# Patient Record
Sex: Female | Born: 1945 | Race: White | Hispanic: No | Marital: Married | Smoking: Never smoker
Health system: Southern US, Community
[De-identification: ages and names within clinical notes are randomized; demographics above are authoritative.]

## PROBLEM LIST (undated history)

## (undated) DIAGNOSIS — K5790 Diverticulosis of intestine, part unspecified, without perforation or abscess without bleeding: Secondary | ICD-10-CM

## (undated) DIAGNOSIS — K2970 Gastritis, unspecified, without bleeding: Secondary | ICD-10-CM

## (undated) DIAGNOSIS — Z9889 Other specified postprocedural states: Secondary | ICD-10-CM

## (undated) DIAGNOSIS — M109 Gout, unspecified: Secondary | ICD-10-CM

## (undated) DIAGNOSIS — K649 Unspecified hemorrhoids: Secondary | ICD-10-CM

## (undated) DIAGNOSIS — H9190 Unspecified hearing loss, unspecified ear: Secondary | ICD-10-CM

## (undated) DIAGNOSIS — F419 Anxiety disorder, unspecified: Secondary | ICD-10-CM

## (undated) DIAGNOSIS — R011 Cardiac murmur, unspecified: Secondary | ICD-10-CM

## (undated) DIAGNOSIS — T7840XA Allergy, unspecified, initial encounter: Secondary | ICD-10-CM

## (undated) DIAGNOSIS — Z87442 Personal history of urinary calculi: Secondary | ICD-10-CM

## (undated) DIAGNOSIS — N200 Calculus of kidney: Secondary | ICD-10-CM

## (undated) DIAGNOSIS — K219 Gastro-esophageal reflux disease without esophagitis: Secondary | ICD-10-CM

## (undated) DIAGNOSIS — IMO0001 Reserved for inherently not codable concepts without codable children: Secondary | ICD-10-CM

## (undated) DIAGNOSIS — E039 Hypothyroidism, unspecified: Secondary | ICD-10-CM

## (undated) DIAGNOSIS — E785 Hyperlipidemia, unspecified: Secondary | ICD-10-CM

## (undated) DIAGNOSIS — M1990 Unspecified osteoarthritis, unspecified site: Secondary | ICD-10-CM

## (undated) DIAGNOSIS — K760 Fatty (change of) liver, not elsewhere classified: Secondary | ICD-10-CM

## (undated) DIAGNOSIS — M8580 Other specified disorders of bone density and structure, unspecified site: Secondary | ICD-10-CM

## (undated) DIAGNOSIS — R112 Nausea with vomiting, unspecified: Secondary | ICD-10-CM

## (undated) DIAGNOSIS — I10 Essential (primary) hypertension: Secondary | ICD-10-CM

## (undated) DIAGNOSIS — E079 Disorder of thyroid, unspecified: Secondary | ICD-10-CM

## (undated) HISTORY — DX: Essential (primary) hypertension: I10

## (undated) HISTORY — PX: KNEE ARTHROSCOPY: SUR90

## (undated) HISTORY — DX: Disorder of thyroid, unspecified: E07.9

## (undated) HISTORY — PX: COLONOSCOPY: SHX174

## (undated) HISTORY — DX: Unspecified osteoarthritis, unspecified site: M19.90

## (undated) HISTORY — DX: Gastritis, unspecified, without bleeding: K29.70

## (undated) HISTORY — DX: Calculus of kidney: N20.0

## (undated) HISTORY — DX: Hypothyroidism, unspecified: E03.9

## (undated) HISTORY — PX: TONSILLECTOMY: SUR1361

## (undated) HISTORY — DX: Unspecified hearing loss, unspecified ear: H91.90

## (undated) HISTORY — DX: Reserved for inherently not codable concepts without codable children: IMO0001

## (undated) HISTORY — DX: Anxiety disorder, unspecified: F41.9

## (undated) HISTORY — DX: Other specified disorders of bone density and structure, unspecified site: M85.80

## (undated) HISTORY — PX: OTHER SURGICAL HISTORY: SHX169

## (undated) HISTORY — DX: Cardiac murmur, unspecified: R01.1

## (undated) HISTORY — DX: Fatty (change of) liver, not elsewhere classified: K76.0

## (undated) HISTORY — DX: Hyperlipidemia, unspecified: E78.5

## (undated) HISTORY — DX: Gastro-esophageal reflux disease without esophagitis: K21.9

## (undated) HISTORY — PX: TUBAL LIGATION: SHX77

## (undated) HISTORY — DX: Gout, unspecified: M10.9

## (undated) HISTORY — DX: Unspecified hemorrhoids: K64.9

## (undated) HISTORY — DX: Allergy, unspecified, initial encounter: T78.40XA

## (undated) HISTORY — DX: Diverticulosis of intestine, part unspecified, without perforation or abscess without bleeding: K57.90

---

## 1966-02-12 HISTORY — PX: OVARIAN CYST SURGERY: SHX726

## 1973-02-12 HISTORY — PX: DILATION AND CURETTAGE OF UTERUS: SHX78

## 1985-02-12 HISTORY — PX: TOTAL ABDOMINAL HYSTERECTOMY W/ BILATERAL SALPINGOOPHORECTOMY: SHX83

## 1993-02-12 HISTORY — PX: KNEE ARTHROSCOPY: SUR90

## 1996-02-13 HISTORY — PX: HAND SURGERY: SHX662

## 1997-05-21 ENCOUNTER — Ambulatory Visit (HOSPITAL_COMMUNITY): Admission: RE | Admit: 1997-05-21 | Discharge: 1997-05-21 | Payer: Self-pay | Admitting: *Deleted

## 1999-02-13 HISTORY — PX: WRIST FRACTURE SURGERY: SHX121

## 1999-07-14 ENCOUNTER — Encounter: Admission: RE | Admit: 1999-07-14 | Discharge: 1999-07-14 | Payer: Self-pay | Admitting: *Deleted

## 1999-07-14 ENCOUNTER — Encounter: Payer: Self-pay | Admitting: *Deleted

## 1999-07-31 ENCOUNTER — Encounter: Payer: Self-pay | Admitting: Emergency Medicine

## 1999-07-31 ENCOUNTER — Emergency Department (HOSPITAL_COMMUNITY): Admission: EM | Admit: 1999-07-31 | Discharge: 1999-07-31 | Payer: Self-pay | Admitting: Emergency Medicine

## 2000-07-18 ENCOUNTER — Encounter: Admission: RE | Admit: 2000-07-18 | Discharge: 2000-07-18 | Payer: Self-pay | Admitting: Internal Medicine

## 2000-07-18 ENCOUNTER — Encounter: Payer: Self-pay | Admitting: Internal Medicine

## 2001-07-21 ENCOUNTER — Encounter: Payer: Self-pay | Admitting: Internal Medicine

## 2001-07-21 ENCOUNTER — Ambulatory Visit (HOSPITAL_COMMUNITY): Admission: RE | Admit: 2001-07-21 | Discharge: 2001-07-21 | Payer: Self-pay | Admitting: Internal Medicine

## 2001-11-10 ENCOUNTER — Encounter: Payer: Self-pay | Admitting: Internal Medicine

## 2001-11-10 ENCOUNTER — Ambulatory Visit (HOSPITAL_COMMUNITY): Admission: RE | Admit: 2001-11-10 | Discharge: 2001-11-10 | Payer: Self-pay | Admitting: Internal Medicine

## 2002-07-27 ENCOUNTER — Encounter: Payer: Self-pay | Admitting: Internal Medicine

## 2002-07-27 ENCOUNTER — Encounter: Admission: RE | Admit: 2002-07-27 | Discharge: 2002-07-27 | Payer: Self-pay | Admitting: Internal Medicine

## 2002-12-22 ENCOUNTER — Encounter (INDEPENDENT_AMBULATORY_CARE_PROVIDER_SITE_OTHER): Payer: Self-pay | Admitting: *Deleted

## 2002-12-22 ENCOUNTER — Ambulatory Visit (HOSPITAL_COMMUNITY): Admission: RE | Admit: 2002-12-22 | Discharge: 2002-12-22 | Payer: Self-pay | Admitting: Internal Medicine

## 2003-02-10 ENCOUNTER — Encounter: Payer: Self-pay | Admitting: Internal Medicine

## 2003-07-30 ENCOUNTER — Encounter: Admission: RE | Admit: 2003-07-30 | Discharge: 2003-07-30 | Payer: Self-pay | Admitting: Internal Medicine

## 2003-09-06 ENCOUNTER — Encounter: Payer: Self-pay | Admitting: Internal Medicine

## 2004-07-31 ENCOUNTER — Encounter: Admission: RE | Admit: 2004-07-31 | Discharge: 2004-07-31 | Payer: Self-pay | Admitting: Internal Medicine

## 2005-08-02 ENCOUNTER — Encounter: Admission: RE | Admit: 2005-08-02 | Discharge: 2005-08-02 | Payer: Self-pay | Admitting: Internal Medicine

## 2006-08-15 ENCOUNTER — Encounter: Admission: RE | Admit: 2006-08-15 | Discharge: 2006-08-15 | Payer: Self-pay | Admitting: *Deleted

## 2007-01-02 ENCOUNTER — Encounter: Admission: RE | Admit: 2007-01-02 | Discharge: 2007-02-12 | Payer: Self-pay | Admitting: Orthopedic Surgery

## 2007-01-11 ENCOUNTER — Ambulatory Visit (HOSPITAL_COMMUNITY): Admission: RE | Admit: 2007-01-11 | Discharge: 2007-01-11 | Payer: Self-pay | Admitting: Orthopedic Surgery

## 2007-08-19 ENCOUNTER — Encounter: Admission: RE | Admit: 2007-08-19 | Discharge: 2007-08-19 | Payer: Self-pay | Admitting: Family Medicine

## 2008-03-24 ENCOUNTER — Other Ambulatory Visit: Admission: RE | Admit: 2008-03-24 | Discharge: 2008-03-24 | Payer: Self-pay | Admitting: Family Medicine

## 2008-08-19 ENCOUNTER — Encounter: Admission: RE | Admit: 2008-08-19 | Discharge: 2008-08-19 | Payer: Self-pay | Admitting: Family Medicine

## 2008-08-30 ENCOUNTER — Encounter: Admission: RE | Admit: 2008-08-30 | Discharge: 2008-08-30 | Payer: Self-pay | Admitting: Family Medicine

## 2009-04-21 ENCOUNTER — Ambulatory Visit (HOSPITAL_COMMUNITY): Admission: RE | Admit: 2009-04-21 | Discharge: 2009-04-21 | Payer: Self-pay | Admitting: Family Medicine

## 2009-08-26 ENCOUNTER — Encounter: Admission: RE | Admit: 2009-08-26 | Discharge: 2009-08-26 | Payer: Self-pay | Admitting: Family Medicine

## 2009-09-20 DIAGNOSIS — Z87442 Personal history of urinary calculi: Secondary | ICD-10-CM | POA: Insufficient documentation

## 2009-09-20 DIAGNOSIS — K5730 Diverticulosis of large intestine without perforation or abscess without bleeding: Secondary | ICD-10-CM | POA: Insufficient documentation

## 2009-09-20 DIAGNOSIS — R109 Unspecified abdominal pain: Secondary | ICD-10-CM | POA: Insufficient documentation

## 2009-09-20 DIAGNOSIS — K7689 Other specified diseases of liver: Secondary | ICD-10-CM | POA: Insufficient documentation

## 2009-09-20 DIAGNOSIS — M1990 Unspecified osteoarthritis, unspecified site: Secondary | ICD-10-CM | POA: Insufficient documentation

## 2009-09-20 DIAGNOSIS — K649 Unspecified hemorrhoids: Secondary | ICD-10-CM | POA: Insufficient documentation

## 2009-09-20 DIAGNOSIS — E785 Hyperlipidemia, unspecified: Secondary | ICD-10-CM | POA: Insufficient documentation

## 2009-09-20 DIAGNOSIS — I10 Essential (primary) hypertension: Secondary | ICD-10-CM | POA: Insufficient documentation

## 2009-09-20 DIAGNOSIS — Z8719 Personal history of other diseases of the digestive system: Secondary | ICD-10-CM | POA: Insufficient documentation

## 2009-09-26 ENCOUNTER — Ambulatory Visit: Payer: Self-pay | Admitting: Internal Medicine

## 2009-09-26 DIAGNOSIS — E039 Hypothyroidism, unspecified: Secondary | ICD-10-CM | POA: Insufficient documentation

## 2009-09-26 DIAGNOSIS — F411 Generalized anxiety disorder: Secondary | ICD-10-CM | POA: Insufficient documentation

## 2009-10-04 ENCOUNTER — Ambulatory Visit (HOSPITAL_COMMUNITY): Admission: RE | Admit: 2009-10-04 | Discharge: 2009-10-04 | Payer: Self-pay | Admitting: Internal Medicine

## 2009-10-06 ENCOUNTER — Telehealth: Payer: Self-pay | Admitting: Internal Medicine

## 2009-10-24 ENCOUNTER — Ambulatory Visit (HOSPITAL_COMMUNITY): Admission: RE | Admit: 2009-10-24 | Discharge: 2009-10-24 | Payer: Self-pay | Admitting: Internal Medicine

## 2010-01-11 ENCOUNTER — Telehealth: Payer: Self-pay | Admitting: Internal Medicine

## 2010-01-12 ENCOUNTER — Encounter: Payer: Self-pay | Admitting: Internal Medicine

## 2010-02-22 ENCOUNTER — Encounter (INDEPENDENT_AMBULATORY_CARE_PROVIDER_SITE_OTHER): Payer: Self-pay | Admitting: *Deleted

## 2010-02-23 ENCOUNTER — Ambulatory Visit
Admission: RE | Admit: 2010-02-23 | Discharge: 2010-02-23 | Payer: Self-pay | Source: Home / Self Care | Attending: Internal Medicine | Admitting: Internal Medicine

## 2010-03-02 ENCOUNTER — Other Ambulatory Visit: Payer: Self-pay | Admitting: Internal Medicine

## 2010-03-02 ENCOUNTER — Ambulatory Visit
Admission: RE | Admit: 2010-03-02 | Discharge: 2010-03-02 | Payer: Self-pay | Source: Home / Self Care | Attending: Internal Medicine | Admitting: Internal Medicine

## 2010-03-06 ENCOUNTER — Encounter: Payer: Self-pay | Admitting: Family Medicine

## 2010-03-07 ENCOUNTER — Encounter: Payer: Self-pay | Admitting: Internal Medicine

## 2010-03-16 NOTE — Progress Notes (Signed)
Summary: triage  Phone Note Call from Patient Call back at Home Phone [phone]   Caller: Patient Call For: Dr. Juanda Chance Reason for Call: Talk to Nurse Details for Reason: triage Summary of Call: Pt. called to state she had a vomiting episode last night.  She did try to double Protonix but could not tolerate it as it gave her severe diarrhea. She had been asked to inform Dr. Juanda Chance if she had any episodes.  Any questions, call pt. She will be at the # above until 1 p.m.  Otherwise, you may leave her a voice mail. Initial call taken by: Schuyler Amor,  January 11, 2010 10:55 AM  Follow-up for Phone Call         Patient saw Dr. Juanda Chance in 09/2009 and she told her to call for any episdoes of abdominal pain and vomiting.Patient calling to report an episode last night of abdominal pain in the epigastric area and vomiting. Patient reports this occurred after she ate a tuna salad sandwich and chicken rice soup. She is a "little sore in the right upper quadrant today but has not had any vomiting today.  She did try to take her Protonix two times a day as requested by Dr. Juanda Chance back in the fall but this gave her diarrhea so she has been taking it daily. Patient wants to know if she should change medications or get an EGD. Please, advise. Follow-up by: Jesse Fall RN,  January 11, 2010 11:50 AM  Additional Follow-up for Phone Call Additional follow up Details #1::        As per my last OV note 09/2009,   if vomiting recurrs, schedule EGD. Her sono and HIDA were normal. In place of Protonix she may purchase Pepcid  20 mg and take it two times a day. OK to schedule direct EGD. Additional Follow-up by: Hart Carwin MD,  January 11, 2010 10:18 PM    Additional Follow-up for Phone Call Additional follow up Details #2::    Message left for patient to call back at home number. Jesse Fall RN  January 12, 2010 9:41 AM Spoke with patient. Patient notified of Dr. Regino Schultze recommendations. She will  take Pepcid 20 mg two times a day in place of Protonix. Scheduled patient for direct EGD on 03/02/2010 at 9 AM with Dr. Juanda Chance and previsit on 02/23/2010 at 1:00 PM.  Previsit letter mailed. Follow-up by: Jesse Fall RN,  January 12, 2010 3:03 PM  New/Updated Medications: PEPCID 20 MG TABS (FAMOTIDINE) Take one by mouth two times a day

## 2010-03-16 NOTE — Miscellaneous (Signed)
Summary: [facility] Previsit/prep  Clinical Lists Changes  Allergies: Changed allergy or adverse reaction from SULFA to SULFA Changed allergy or adverse reaction from DARVON to DARVON Changed allergy or adverse reaction from MORPHINE to MORPHINE Changed allergy or adverse reaction from * EPINEPHRINE to * EPINEPHRINE

## 2010-03-16 NOTE — Assessment & Plan Note (Signed)
Summary: Gastroenterology  Vanessa Skinner  MR#:  [id number] Page #  NAME:  Vanessa Skinner, Vanessa Skinner   OFFICE NO:  [id number]  DATE:  02/10/03  DOB:  [birth year]  HISTORY OF PRESENT ILLNESS:  The patient is a very nice 65 year old white female, who comes for discussion of colorectal screening. She also has symptoms of epigastric discomfort, which comes periodically since she has been on Fosamax. She has also tried Actonel and calcium supplements. About four months ago, the patient had a physical exam and was found to have epigastric tenderness and pain. She was put on Protonix 40 mg twice a day for two weeks, which helped tremendously. But about after a month of Protonix, the pain started again. She had an ultrasound of the gallbladder, which showed a fatty liver but normal gallbladder and bile ducts. She is quite concerned about the fatty liver diagnosis. She states that she had normal liver function tests by Dr. Daphine Deutscher. She does not drink alcohol other than socially. She has never had blood transfusions or hepatitis.  Her symptoms consist of irregular bowel habits, mostly constipation; food intolerance after certain meals, especially after rich foods; and uncomfortable rectal feeling consistent with symptomatic hemorrhoids.  PAST MEDICAL HISTORY:  Significant for high blood pressure, hyperlipidemia, thyroid problems since 1987, degenerative joint disease since 1994, kidney stones in 1994 and 1995. Allergies and sinus trouble. Operations include hysterectomy in 1987, tubal ligation in 1976.  FAMILY HISTORY:  Positive for heart disease in both mother and father and grandparents, breast cancer in maternal aunt, diabetes in father, and alcoholism in sister.  SOCIAL HISTORY:  She is married. She has children. 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 She has a college degree. She does not smoke, and drinks alcohol only occasionally.  REVIEW OF SYSTEMS: Positive for eyeglasses, allergies, swelling of her  feet and legs, arthritis of the joints, skin rashes, vision changes, nose bleeds, itching, and hearing problems.  PHYSICAL EXAM:  Blood pressure 136/70. Age past 65. Weight 172 pounds. She is alert and oriented and in no distress. Skin is warm and dry. Sclerae nonicteric. Neck is supple without adenopathy. Lungs are clear to auscultation. COR: Normal S1 and S2. Abdomen is soft with tenderness in the epigastric area. Liver edge was at the costal margin and overall liver span was only about 9-10 cm. There is no tenderness in the liver. No ascites. Bowel sounds are normal. Spleen no palpable. Rectal exam: Normal rectal tone. Hemoccult negative stool.  IMPRESSION:   40.  A 65 year old white female with non-specific dyspepsia, which may be related to use to Fosamax. Rule out hiatal hernia, H. pylori gastritis. It is probably not a biliary problem because her ultrasound was negative. 2.  Fatty liver with normal liver function tests. No stigmata of chronic liver disease on physical exam. Fatty liver may be related to hyperlipidemia and mild obesity. 3.  Patient is a good candidate for screening colonoscopy because of her 65 She has no risk factors other than her age. Her symptoms are suggestive of irritable bowel syndrome or possibly symptomatic diverticulosis.  PLAN:  1.  Protonix 40 mg on a p.r.n. basis. 2.  Start Benefiber as a fiber supplement on a daily basis. 3.  Schedule upper endoscopy and a colonoscopy for evaluation of the upper gastrointestinal symptoms and for colorectal screening. Prep has been discussed with the patient. She did not have her schedule with her, and she has to check her schedule and call us  back concerning timing of her endoscopy and colonoscopy.    Hedwig Morton. Juanda Chance, M.D.  [id number] cc:  Vanessa Skinner D:  02/10/03; T: ; Job [id number]

## 2010-03-16 NOTE — Letter (Signed)
Summary: Patient [facility] Biopsy Results  [facility]  [address] [place], [place] [address]   Phone: [phone]  Fax: [phone]        March 07, 2010 MRN: [id number]    [facility] [address] [place], [place]  [address]    Dear Ms. Larue,  I am pleased to inform you that the biopsies taken during your recent endoscopic examination did not show any evidence of cancer upon pathologic examination.Th biopssies show mild gastritis  Additional information/recommendations:  __No further action is needed at this time.  Please follow-up with      your primary care physician for your other healthcare needs.  __ Please call [phone] to schedule a return visit to review      your condition.  _x_ Continue with the treatment plan as outlined on the day of your      exam.  _   Please call us if you are having persistent problems or have questions about your condition that have not been fully answered at this time.  Sincerely,  Hart Carwin MD  This letter has been electronically signed by your physician.  Appended Document: Patient Notice-Endo Biopsy Results LETTER MAILED

## 2010-03-16 NOTE — Procedures (Addendum)
Summary: Upper Endoscopy  Patient: Vanessa Skinner Note: All result statuses are Final unless otherwise noted.  Tests: (1) Upper Endoscopy (EGD)   EGD Upper Endoscopy       DONE     [facility]     [address]     [place], [place]  [address]           ENDOSCOPY PROCEDURE REPORT           PATIENT:  Vanessa Skinner, Vanessa Skinner  MR#:  [id number]     BIRTHDATE:  1946-01-16, 64 yrs. old  GENDER:  female           ENDOSCOPIST:  Hedwig Morton. Juanda Chance, MD     Referred by:  Merri Brunette, M.D.           PROCEDURE DATE:  03/02/2010     PROCEDURE:  EGD with biopsy, [address]     ASA CLASS:  Class II     INDICATIONS:  abdominal pain, dyspepsia gastritis on EGD 2005           MEDICATIONS:   Versed mg, Fentanyl 75 mcg     TOPICAL ANESTHETIC:  Exactacain Spray           DESCRIPTION OF PROCEDURE:   After the risks benefits and     alternatives of the procedure were thoroughly explained, informed     consent was obtained.  The LB GIF-H180 [id number] endoscope was     introduced through the mouth and advanced to the second portion of     the duodenum, without limitations.  The instrument was slowly     withdrawn as the mucosa was fully examined.     <<PROCEDUREIMAGES>>           Mild gastritis was found in the body and the antrum of the     stomach. With standard forceps, a biopsy was obtained and sent to     pathology (see image3).  Otherwise the examination was normal (see     image6, image5, image4, and image1). slightly tortuous esophagua     Retroflexed views revealed no abnormalities.    The scope was then     withdrawn from the patient and the procedure completed.           COMPLICATIONS:  None           ENDOSCOPIC IMPRESSION:     1) Mild gastritis in the body and the antrum of the stomach     2) Otherwise normal examination     s/p biopsies to r/o H.pylori     RECOMMENDATIONS:     1) Await biopsy results     continue Protonix 40 mg po qd           REPEAT EXAM:  In 0 year(s) for.        ______________________________     Hedwig Morton. Juanda Chance, MD           CC:  Merri Brunette, M.D.           n.     eSIGNED:   Hedwig Morton. Jakaila Norment at 03/02/2010 09:54 AM           Rikki Spearing, [id number]  Note: An exclamation mark (!) indicates a result that was not dispersed into the flowsheet. Document Creation Date: 03/02/2010 9:55 AM _______________________________________________________________________  (1) Order result status: Final Collection or observation date-time: 03/02/2010 09:46 Requested date-time:  Receipt date-time:  Reported date-time:  Referring Physician:   Ordering Physician: Verlee Monte  Juanda Chance [phone]) Specimen Source:  Source: Launa Grill Order Number: [id number] Lab site:   Appended Document: Upper Endoscopy no recall EGD

## 2010-03-16 NOTE — Letter (Signed)
Summary: Previsit letter  [facility]  [address] [place], [place] [address]   Phone: [phone]  Fax: [phone]       01/12/2010 MRN: [id number]  [facility] [address] [place], [place]  [address]  Dear Ms. Akhtar,  Welcome to the Gastroenterology Division at [facility].    You are scheduled to see a nurse for your pre-procedure visit on 02/23/2010 at 1:00 PM on the 3rd floor at [facility], [address]  We ask that you try to arrive at our office 15 minutes prior to your appointment time to allow for check-in.  Your nurse visit will consist of discussing your medical and surgical history, your immediate family medical history, and your medications.    Please bring a complete list of all your medications or, if you prefer, bring the medication bottles and we will list them.  We will need to be aware of both prescribed and over the counter drugs.  We will need to know exact dosage information as well.  If you are on blood thinners (Coumadin, Plavix, Aggrenox, Ticlid, etc.) please call our office today/prior to your appointment, as we need to consult with your physician about holding your medication.   Please be prepared to read and sign documents such as consent forms, a financial agreement, and acknowledgement forms.  If necessary, and with your consent, a friend or relative is welcome to sit-in on the nurse visit with you.  Please bring your insurance card so that we may make a copy of it.  If your insurance requires a referral to see a specialist, please bring your referral form from your primary care physician.  No co-pay is required for this nurse visit.     If you cannot keep your appointment, please call [phone] to cancel or reschedule prior to your appointment date.  This allows [place] the opportunity to schedule an appointment for another patient in need of care.    Thank you for choosing [facility] for your medical needs.   We appreciate the opportunity to care for you.  Please visit [place] at our website  to learn more about our practice.                     Sincerely.                                                                                                                   The Gastroenterology Division

## 2010-03-16 NOTE — Progress Notes (Signed)
Summary: Returning your call  Phone Note Call from Patient Call back at [phone]   Caller: Patient Call For: Dr. Juanda Chance Summary of Call: Pt returning your call re: labs  Initial call taken by: Misty Stanley,  October 06, 2009 10:15 AM  Follow-up for Phone Call        patient advied of HIDA scan scheduled for 11/02/09 8:00 at [facility].  patient aware to be NPO Follow-up by: Darcey Nora RN, CGRN,  October 06, 2009 10:45 AM

## 2010-03-16 NOTE — Assessment & Plan Note (Signed)
Summary: upper gastic pain--ch.   History of Present Illness Visit Type: Initial Visit Primary GI MD: Lina Sar MD Primary Alix Lahmann: Merri Brunette, MD Chief Complaint: Patient was having some bloating and epigasttic pain but she is doing good on Align she started in July.  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 The first time she had bacon annnd egg biscuit  and the second time had a barbecue sandwich. The pain was more to the right upper quadrant and was associated with diarrhea. There was no fever or jaundice. An upper abdominal ultrasound in November 2004 showed fatty infiltration of the liver. A colonoscopy in July 2005 showed diverticulosis of the left colon hemorrhoids. She has a positive family history of colon cancer in her grandmother who is 67 years old at the time of diagnosis. Patient has been on Protonix 40 mg daily which doesn not seem to help with the abdominal pain. An upper endoscopy in July 2005 showed acute gastritis possibly related to Fosamax. She has not taken any Fosamax or related medications over the past several years. She has tried probiotics which seemed to help.   GI Review of Systems    Reports bloating.      Denies abdominal pain, acid reflux, belching, chest pain, dysphagia with liquids, dysphagia with solids, heartburn, loss of appetite, nausea, vomiting, vomiting blood, weight loss, and  weight gain.        Denies anal fissure, black tarry stools, change in bowel habit, constipation, diarrhea, diverticulosis, fecal incontinence, heme positive stool, hemorrhoids, irritable bowel syndrome, jaundice, light color stool, liver problems, rectal bleeding, and  rectal pain. Preventive Screening-Counseling & Management      Drug Use:  no.      Current Medications (verified): 1)  Align  Caps (Probiotic Product)  .... Take One By Mouth Once Daily 2)  Synthroid 75 Mcg Tabs (Levothyroxine Sodium) .... Take One By Mouth Once Daily 3)  Tekturna 150 Mg Tabs (Aliskiren Fumarate) .... Take One By Mouth Once Daily 4)  Pravachol 20 Mg Tabs (Pravastatin Sodium) .... Take One By Mouth Once Daily 5)  Aspirin 81 Mg Tbec (Aspirin) .... Take One By Mouth Once Daily 6)  Protonix 40 Mg Tbec (Pantoprazole Sodium) .... Take One By Mouth As Needed 7)  Bentyl 20 Mg Tabs (Dicyclomine Hcl) .... Take One By Mouth As Needed 8)  Allegra 180 Mg Tabs (Fexofenadine Hcl) .... Take One By Mouth As Needed 9)  Lasix 20 Mg Tabs (Furosemide) .... Take One By Mouth As Needed 10)  Xanax 0.5 Mg Tabs (Alprazolam) .... Take One By Mouth As Needed 11)  Vitamin D 400 Unit Tabs (Cholecalciferol) .... Take One By Mouth As Needed  Allergies: 1)  ! Sulfa 2)  ! Darvon 3)  ! Morphine 4)  ! * Epinephrine  Past History:  Past Medical History: Current Problems:  HYPOTHYROIDISM (ICD-244.9) ANXIETY (ICD-300.00) ABDOMINAL PAIN, UNSPECIFIED SITE (ICD-789.00) HEMORRHOIDS (ICD-455.6) DIVERTICULOSIS, COLON (ICD-562.10) GASTRITIS, HX OF (ICD-V12.79) NEPHROLITHIASIS, HX OF (ICD-V13.01) DEGENERATIVE JOINT DISEASE (ICD-715.90) HYPERLIPIDEMIA (ICD-272.4) HYPERTENSION (ICD-401.9) Family Hx of COLON CANCER (ICD-153.9) FATTY LIVER DISEASE (ICD-571.8)  Past Surgical History: Hysterectomy D & C  Tonsillectomy RightHand Surgery Right Knee Arthroscopy Tubal Ligation left arm surgery  Family History: Family History of Colon Cancer: Maternal Grandmother Family History of Diabetes: Father Family History of Heart Disease: Mother, Father, Maternal  Grandfather Family History of Kidney Disease: Father  Social History: Occupation: Midwife Stay Alcohol Use - No Illicit Drug Use - no Daily Caffeine Use 2-3 per day  Review of Systems       The patient complains of allergy/sinus, anxiety-new, arthritis/joint pain, hearing problems, and  shortness of breath.  The patient denies anemia, back pain, blood in urine, breast changes/lumps, change in vision, confusion, cough, coughing up blood, depression-new, fainting, fatigue, fever, headaches-new, heart murmur, heart rhythm changes, itching, menstrual pain, muscle pains/cramps, night sweats, nosebleeds, pregnancy symptoms, skin rash, sleeping problems, sore throat, swelling of feet/legs, swollen lymph glands, thirst - excessive , urination - excessive , urination changes/pain, urine leakage, vision changes, and voice change.         Pertinent positive and negative review of systems were noted in the above HPI. All other ROS was otherwise negative.   Vital Signs:  Patient profile:   65 year old female Height:      64.5 inches Weight:      189.4 pounds BMI:     32.12 Pulse rate:   74 / minute Pulse rhythm:   regular BP sitting:   140 / 90  (left arm) Cuff size:   regular  Vitals Entered By: Harlow Mares CMA Duncan Dull) (September 26, 2009 2:58 PM)  Physical Exam  General:  alert, oriented, somewhat overweight. Eyes:  nonicteric. Mouth:  No deformity or lesions, dentition normal. Neck:  Supple; no masses or thyromegaly. Lungs:  Clear throughout to auscultation. Heart:  Regular rate and rhythm; no murmurs, rubs,  or bruits. Abdomen:  protuberant soft abdomen which is tender in right upper quadrant. Liver appears to be tender. Left upper and lower quadrants are normal. Normoactive bowel sounds. No tympany. Rectal:  soft Hemoccult negative stool. Extremities:  No clubbing, cyanosis, edema or deformities noted. Skin:  Intact without significant lesions or rashes. Psych:  Alert and cooperative. Normal mood and affect.   Impression & Recommendations:  Problem # 1:  ABDOMINAL PAIN, UNSPECIFIED SITE (ICD-789.00) consider symptomatic biliary disease, if sono negative, consider HIDA scan. If negative, will proceed with EGD. Orders: Ultrasound Abdomen (UAS)  Problem # 2:   DIVERTICULOSIS, COLON (ICD-562.10) Patient has moderately severe diverticulosis seen on a colonoscopy in 2005. She is not currently symptomatic.  Patient Instructions: 1)  Abdominal ultrasound, if negative proceed with HIDA scan. 2)  If biliary workup is negative, consider upper endoscopy.  3)  Continue Protonix 40 mg daily. 4)  Low-fat diet. 5)  Copy sent to : Dr Merri Brunette 6)  The medication list was reviewed and reconciled.  All changed / newly prescribed medications were explained.  A complete medication list was provided to the patient / caregiver. Prescriptions: BENTYL 20 MG TABS (DICYCLOMINE HCL) Take 1 tablet by mouth as needed for abdominal cramping  #30 x 1   Entered by:   Lamona Curl CMA (AAMA)   Authorized by:   Hart Carwin MD   Signed by:   Lamona Curl CMA (AAMA) on 09/26/2009   Method used:   Electronically to        [facility]* (retail)       [address]       [address] Shipping/mailing       [place], [place]  [address]       Ph: [phone]       Fax: [phone]   RxID:   [id number]

## 2010-03-16 NOTE — Procedures (Signed)
Summary: EGD   EGD  Procedure date:  09/06/2003  Findings:      Location: [facility]   Patient Name: Vanessa Skinner, Vanessa Skinner MRN:  Procedure Procedures: Panendoscopy (EGD) CPT: 43235.    with biopsy(s)/brushing(s). CPT: D1846139.  Personnel: Endoscopist: Dora L. Juanda Chance, MD.  Referred By: Maureen Ralphs Daphine Deutscher, MD.  Exam Location: Exam performed in Outpatient Clinic. Outpatient  Patient Consent: Procedure, Alternatives, Risks and Benefits discussed, consent obtained, from patient. Consent was obtained by the RN.  Indications Symptoms: Abdominal pain, location: epigastric.  History  Current Medications: Patient is taking a non-steroidal medication. Patient is not currently taking Coumadin.  Pre-Exam Physical: Performed Sep 06, 2003  Cardio-pulmonary exam, HEENT exam, Abdominal exam, Extremity exam, Neurological exam, Mental status exam WNL.  Exam Exam Info: Maximum depth of insertion Duodenum, intended Duodenum. Vocal cords visualized. Gastric retroflexion performed. Images taken. ASA Classification: I. Tolerance: good.  Sedation Meds: Patient assessed and found to be appropriate for moderate (conscious) sedation. Fentanyl 50 mcg. given IV. Versed 5 mg. given IV. Cetacaine Spray 2 sprays given aerosolized.  Monitoring: BP and pulse monitoring done. Oximetry used. Supplemental O2 given  Findings - MUCOSAL ABNORMALITY: Body to Antrum. Erythematous mucosa. Mosaic/scaly mucosa. Granular mucosa. RUT done, results pending. ICD9: Gastritis, Acute: 535.00.   Assessment Abnormal examination, see findings above.  Diagnoses: 535.00: Gastritis, Acute.   Comments: antral gastritis, s/p CLO test Events  Unplanned Intervention: No unplanned interventions were required.  Unplanned Events: There were no complications. Plans Medication(s): Await pathology. PPI: Pantoprazole/Protonix 40 mg QD, starting Sep 06, 2003  Other: Bentyl 20mg  prn, starting Sep 06, 2003    Comments: sono of the gall bladder was normal, gastritis may be related to ASA or Fosamax  Disposition: After procedure patient sent to recovery. After recovery patient sent home.   This report was created from the original endoscopy report, which was reviewed and signed by the above listed endoscopist.    Appended Document: EGD Although note indicates that a CLO test was taken, I am unable to locate results in either the paper chart or in EChart.

## 2010-03-16 NOTE — Procedures (Signed)
Summary: COLON   Colonoscopy  Procedure date:  09/06/2003  Findings:      Location:  [facility].    Procedures Next Due Date:    Colonoscopy: 09/2010 Patient Name: Vanessa Skinner, Vanessa Skinner MRN:  Procedure Procedures: Colonoscopy CPT: [address].  Personnel: Endoscopist: Dora L. Juanda Chance, MD.  Referred By: Maureen Ralphs Daphine Deutscher, MD.  Exam Location: Exam performed in Outpatient Clinic. Outpatient  Patient Consent: Procedure, Alternatives, Risks and Benefits discussed, consent obtained, from patient. Consent was obtained by the RN.  Indications  Increased Risk Screening: For family history of colorectal neoplasia, in  grandparent  History  Current Medications: Patient is taking an non-steroidal medication. Patient is not currently taking Coumadin.  Pre-Exam Physical: Performed Sep 06, 2003. Cardio-pulmonary exam, Rectal exam, HEENT exam , Abdominal exam, Extremity exam, Neurological exam, Mental status exam WNL.  Exam Exam: Extent of exam reached: Cecum, extent intended: Cecum.  The cecum was identified by appendiceal orifice and IC valve. Colon retroflexion performed. Images taken. ASA Classification: I. Tolerance: good.  Monitoring: Pulse and BP monitoring, Oximetry used. Supplemental O2 given.  Colon Prep Used Miralax for colon prep. Prep results: good.  Sedation Meds: Patient assessed and found to be appropriate for moderate (conscious) sedation. Fentanyl 50 mcg. given IV. Versed 5 mg. given IV.  Findings - DIVERTICULOSIS: Sigmoid Colon. ICD9: Diverticulosis: 562.10. Comments: moderately severe, narrow lumen, thick folds in the sigmoid colon.   Assessment Abnormal examination, see findings above.  Diagnoses: 562.10: Diverticulosis.   Comments: no polyps, Events  Unplanned Interventions: No intervention was required.  Unplanned Events: There were no complications. Plans Medication Plan: Fiber supplements: Psyllium 1 Tbsp QD, starting Sep 06, 2003    Patient Education: Patient given standard instructions for: Yearly hemoccult testing recommended. Patient instructed to get routine colonoscopy every 7 years.  Disposition: After procedure patient sent to recovery. After recovery patient sent home.   This report was created from the original endoscopy report, which was reviewed and signed by the above listed endoscopist.

## 2010-03-16 NOTE — Letter (Signed)
Summary: EGD Instructions  [facility]  [address] [place], [place] [address]   Phone: [phone]  Fax: [phone]       Vanessa Skinner    13-Feb-1946    MRN: [id number]       Procedure Day Dorna Bloom:  Lenor Coffin  03/02/10     Arrival Time:   8:00AM     Procedure Time:  9:00AM     Location of Procedure:                    _ X _ [facility] (4th Floor)   PREPARATION FOR ENDOSCOPY   On 03/02/10 THE DAY OF THE PROCEDURE:  1.   No solid foods, milk or milk products are allowed after midnight the night before your procedure.  2.   Do not drink anything colored red or purple.  Avoid juices with pulp.  No orange juice.  3.  You may drink clear liquids until 7:00AM, which is 2 hours before your procedure.                                                                                                CLEAR LIQUIDS INCLUDE: Water Jello Ice Popsicles Tea (sugar ok, no milk/cream) Powdered fruit flavored drinks Coffee (sugar ok, no milk/cream) Gatorade Juice: apple, white grape, white cranberry  Lemonade Clear bullion, consomm, broth Carbonated beverages (any kind) Strained chicken noodle soup Hard Candy   MEDICATION INSTRUCTIONS  Unless otherwise instructed, you should take regular prescription medications with a small sip of water as early as possible the morning of your procedure.   Additional medication instructions: Hold Lasix the morning of procedure.             OTHER INSTRUCTIONS  You will need a responsible adult at least 67 years of age to accompany you and drive you home.   This person must remain in the waiting room during your procedure.  Wear loose fitting clothing that is easily removed.  Leave jewelry and other valuables at home.  However, you may wish to bring a book to read or an iPod/MP3 player to listen to music as you wait for your procedure to start.  Remove all body piercing jewelry and leave at home.  Total time  from sign-in until discharge is approximately 2-3 hours.  You should go home directly after your procedure and rest.  You can resume normal activities the day after your procedure.  The day of your procedure you should not:   Drive   Make legal decisions   Operate machinery   Drink alcohol   Return to work  You will receive specific instructions about eating, activities and medications before you leave.    The above instructions have been reviewed and explained to me by   Wyona Almas RN  February 23, 2010 1:30 PM     I fully understand and can verbalize these instructions _____________________________ Date _________

## 2010-07-28 ENCOUNTER — Other Ambulatory Visit: Payer: Self-pay | Admitting: Family Medicine

## 2010-07-28 DIAGNOSIS — Z1231 Encounter for screening mammogram for malignant neoplasm of breast: Secondary | ICD-10-CM

## 2010-07-31 ENCOUNTER — Other Ambulatory Visit: Payer: Self-pay | Admitting: Internal Medicine

## 2010-07-31 MED ORDER — DICYCLOMINE HCL 20 MG PO TABS: ORAL_TABLET | ORAL | Status: DC

## 2010-07-31 NOTE — Telephone Encounter (Signed)
rx sent

## 2010-08-24 ENCOUNTER — Encounter: Payer: Self-pay | Admitting: Internal Medicine

## 2010-08-28 ENCOUNTER — Ambulatory Visit
Admission: RE | Admit: 2010-08-28 | Discharge: 2010-08-28 | Disposition: A | Discharge status: Home / Self Care | Payer: Medicare Other | Source: Ambulatory Visit | Attending: Family Medicine | Admitting: Family Medicine

## 2010-08-28 DIAGNOSIS — Z1231 Encounter for screening mammogram for malignant neoplasm of breast: Secondary | ICD-10-CM

## 2010-11-27 ENCOUNTER — Encounter: Payer: Self-pay | Admitting: Internal Medicine

## 2010-12-14 ENCOUNTER — Encounter: Payer: Self-pay | Admitting: Internal Medicine

## 2010-12-14 ENCOUNTER — Ambulatory Visit (AMBULATORY_SURGERY_CENTER): Payer: Medicare Other | Admitting: *Deleted

## 2010-12-14 VITALS — Ht 64.0 in | Wt 185.0 lb

## 2010-12-14 DIAGNOSIS — Z1211 Encounter for screening for malignant neoplasm of colon: Secondary | ICD-10-CM

## 2010-12-14 MED ORDER — PEG-KCL-NACL-NASULF-NA ASC-C 100 G PO SOLR: ORAL | Status: DC

## 2010-12-28 ENCOUNTER — Encounter: Payer: Self-pay | Admitting: Internal Medicine

## 2010-12-28 ENCOUNTER — Ambulatory Visit (AMBULATORY_SURGERY_CENTER): Payer: Medicare Other | Admitting: Internal Medicine

## 2010-12-28 VITALS — BP 170/91 | HR 69 | Temp 96.7°F | Resp 20 | Ht 64.0 in | Wt 185.0 lb

## 2010-12-28 DIAGNOSIS — D126 Benign neoplasm of colon, unspecified: Secondary | ICD-10-CM

## 2010-12-28 DIAGNOSIS — Z1211 Encounter for screening for malignant neoplasm of colon: Secondary | ICD-10-CM

## 2010-12-28 MED ORDER — SODIUM CHLORIDE 0.9 % IV SOLN: 500.0000 mL | INTRAVENOUS | Status: DC

## 2010-12-28 NOTE — Progress Notes (Signed)
Patient did not experience any of the following events: a burn prior to discharge; a fall within the facility; wrong site/side/patient/procedure/implant event; or a hospital transfer or hospital admission upon discharge from the facility. (G8907) Patient did not have preoperative order for IV antibiotic SSI prophylaxis. (G8918)  

## 2010-12-28 NOTE — Patient Instructions (Signed)
FOLLOW THE DISCHARGE INSTRUCTIONS ON THE GREEN AND BLUE  DISCHARGE SHEETS.  CONTINUE YOUR MEDICATIONS. ADD METAMUCIL 1 TSP. DAILY AS A SUPPLEMENT, INCREASE YOUR FLUID INTAKE.  AWAIT PATHOLOGY RESULTS.

## 2010-12-29 ENCOUNTER — Telehealth: Payer: Self-pay | Admitting: *Deleted

## 2010-12-29 NOTE — Telephone Encounter (Signed)

## 2011-01-02 ENCOUNTER — Encounter: Payer: Self-pay | Admitting: Internal Medicine

## 2011-01-08 ENCOUNTER — Encounter: Payer: Self-pay | Admitting: *Deleted

## 2011-01-11 ENCOUNTER — Telehealth: Payer: Self-pay | Admitting: Internal Medicine

## 2011-01-11 NOTE — Telephone Encounter (Signed)
Left message for patient to call back  

## 2011-01-12 NOTE — Telephone Encounter (Signed)
Patient advised, all questions answered

## 2011-06-12 ENCOUNTER — Other Ambulatory Visit: Payer: Self-pay | Admitting: Internal Medicine

## 2011-07-24 ENCOUNTER — Other Ambulatory Visit: Payer: Self-pay | Admitting: Family Medicine

## 2011-07-24 DIAGNOSIS — Z1231 Encounter for screening mammogram for malignant neoplasm of breast: Secondary | ICD-10-CM

## 2011-08-29 ENCOUNTER — Ambulatory Visit
Admission: RE | Admit: 2011-08-29 | Discharge: 2011-08-29 | Disposition: A | Discharge status: Home / Self Care | Payer: Medicare Other | Source: Ambulatory Visit | Attending: Family Medicine | Admitting: Family Medicine

## 2011-08-29 DIAGNOSIS — Z1231 Encounter for screening mammogram for malignant neoplasm of breast: Secondary | ICD-10-CM

## 2011-09-04 ENCOUNTER — Other Ambulatory Visit: Payer: Self-pay | Admitting: Family Medicine

## 2011-09-04 DIAGNOSIS — R928 Other abnormal and inconclusive findings on diagnostic imaging of breast: Secondary | ICD-10-CM

## 2011-09-05 ENCOUNTER — Other Ambulatory Visit: Payer: Medicare Other

## 2011-09-10 ENCOUNTER — Ambulatory Visit
Admission: RE | Admit: 2011-09-10 | Discharge: 2011-09-10 | Disposition: A | Discharge status: Home / Self Care | Payer: Medicare Other | Source: Ambulatory Visit | Attending: Family Medicine | Admitting: Family Medicine

## 2011-09-10 DIAGNOSIS — R928 Other abnormal and inconclusive findings on diagnostic imaging of breast: Secondary | ICD-10-CM

## 2011-10-29 ENCOUNTER — Other Ambulatory Visit: Payer: Self-pay | Admitting: Internal Medicine

## 2012-07-15 ENCOUNTER — Other Ambulatory Visit: Payer: Self-pay

## 2012-07-15 DIAGNOSIS — Z1231 Encounter for screening mammogram for malignant neoplasm of breast: Secondary | ICD-10-CM

## 2012-07-16 ENCOUNTER — Telehealth: Payer: Self-pay | Admitting: Internal Medicine

## 2012-07-16 ENCOUNTER — Other Ambulatory Visit: Payer: Self-pay | Admitting: Internal Medicine

## 2012-07-16 MED ORDER — DICYCLOMINE HCL 20 MG PO TABS: ORAL_TABLET | ORAL | Status: DC

## 2012-07-16 NOTE — Telephone Encounter (Signed)
rx sent to last until appt  

## 2012-08-06 ENCOUNTER — Encounter: Payer: Self-pay | Admitting: *Deleted

## 2012-08-25 ENCOUNTER — Encounter: Payer: Self-pay | Admitting: Internal Medicine

## 2012-08-29 ENCOUNTER — Ambulatory Visit
Admission: RE | Admit: 2012-08-29 | Discharge: 2012-08-29 | Disposition: A | Discharge status: Home / Self Care | Payer: Medicare Other | Source: Ambulatory Visit

## 2012-08-29 DIAGNOSIS — Z1231 Encounter for screening mammogram for malignant neoplasm of breast: Secondary | ICD-10-CM

## 2012-09-02 ENCOUNTER — Ambulatory Visit (INDEPENDENT_AMBULATORY_CARE_PROVIDER_SITE_OTHER): Payer: Medicare Other | Admitting: Internal Medicine

## 2012-09-02 ENCOUNTER — Other Ambulatory Visit: Payer: Self-pay | Admitting: Family Medicine

## 2012-09-02 ENCOUNTER — Encounter: Payer: Self-pay | Admitting: Internal Medicine

## 2012-09-02 VITALS — BP 132/88 | HR 72 | Ht 64.0 in | Wt 189.2 lb

## 2012-09-02 DIAGNOSIS — R1013 Epigastric pain: Secondary | ICD-10-CM

## 2012-09-02 DIAGNOSIS — R928 Other abnormal and inconclusive findings on diagnostic imaging of breast: Secondary | ICD-10-CM

## 2012-09-02 DIAGNOSIS — K648 Other hemorrhoids: Secondary | ICD-10-CM

## 2012-09-02 DIAGNOSIS — K589 Irritable bowel syndrome without diarrhea: Secondary | ICD-10-CM

## 2012-09-02 DIAGNOSIS — K625 Hemorrhage of anus and rectum: Secondary | ICD-10-CM

## 2012-09-02 DIAGNOSIS — K3189 Other diseases of stomach and duodenum: Secondary | ICD-10-CM

## 2012-09-02 MED ORDER — ESOMEPRAZOLE MAGNESIUM 40 MG PO CPDR: 40.0000 mg | DELAYED_RELEASE_CAPSULE | Freq: Every day | ORAL | Status: DC

## 2012-09-02 MED ORDER — HYDROCORTISONE ACETATE 25 MG RE SUPP: 25.0000 mg | Freq: Every day | RECTAL | Status: DC

## 2012-09-02 MED ORDER — DICYCLOMINE HCL 20 MG PO TABS: 20.0000 mg | ORAL_TABLET | Freq: Two times a day (BID) | ORAL | Status: DC

## 2012-09-02 NOTE — Patient Instructions (Addendum)
We have sent the following medications to your pharmacy for you to pick up at your convenience: Nexium Bentyl  Anusol  Please purchase the following medications over the counter and take as directed: Gaviscon-Chew 2 tablets after meals for burning abdominal pain  CC: Dr Merri Brunette

## 2012-09-02 NOTE — Progress Notes (Signed)
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 She had a colonoscopy in November 2012 and has a family history of colon cancer in her grandparents. She has moderate to severe diverticulosis and a rectal polyp which was removed and showed prolapse changes. She comes today because of 3 episodes of low volume hematochezia which occurred in the last 3 months. The last episode was several weeks ago. She denies any rectal pain. She also has dyspepsia and postprandial burning in the epigastrium. She used to be on Protonix 40 mg daily but has not noticed any difference so she just discontinued it. She had a gallbladder evaluation in the past which was negative. An upper endoscopy in November 2012 showed chronic mild gastritis which was H. pylori negative.   Past Medical History  Diagnosis Date  . Allergy     seasonal  . Anxiety     occasional  . Arthritis   . Cataract   . GERD (gastroesophageal reflux disease)   . Heart murmur     MVP  . Hyperlipidemia   . Hypertension   . Osteopenia   . Thyroid disease   . Gastritis   . Diverticulosis   . Fatty liver disease, nonalcoholic   . DJD (degenerative joint disease)   . Nephrolithiasis   . Hemorrhoids   . Hypothyroidism   . Hearing impaired    Past Surgical History  Procedure Laterality Date  . Dilation and curettage of uterus  1975  . Ovarian cyst surgery  1968  . Hand surgery Right 1998  . Total abdominal hysterectomy w/ bilateral salpingoophorectomy  1987  . Knee arthroscopy Right 1995  . Wrist fracture surgery Left 2001  . Colonoscopy    . Arm surgery Left   . Tubal ligation    . Knee arthroscopy Right   . Tonsillectomy      reports that she has never smoked. She has never used smokeless tobacco. She reports that she drinks about 0.6 ounces of alcohol per week. She reports that  she does not use illicit drugs. family history includes Colon cancer in her maternal grandmother; Diabetes in her father; Heart disease in her father, maternal grandfather, and mother; and Kidney disease in her father.  There is no history of Esophageal cancer and Stomach cancer. Allergies  Allergen Reactions  . Epinephrine     REACTION: LARGE AMOUNT/heart races  . Morphine     REACTION: rash/itching  . Propoxyphene Hcl     REACTION: nausea  . Sulfonamide Derivatives     REACTION: Rash/itching        Review of Systems: Occasional diarrhea occasional constipation. Denies dysphagia  The remainder of the 10 point ROS is negative except as outlined in H&P   Physical Exam: General appearance  Well developed, in no distress. Eyes- non icteric. HEENT nontraumatic, normocephalic. Mouth no lesions, tongue papillated, no cheilosis. Neck supple without adenopathy, thyroid not enlarged, no carotid bruits, no JVD. Lungs Clear to auscultation bilaterally. Cor normal S1, normal S2, regular rhythm, no murmur,  quiet precordium. Abdomen: Soft with normoactive bowel sounds. Minimal tenderness in epigastrium.  Rectal: Rectal and anoscopic exam reveals normal perianal area. Normal rectal sphincter tone. Edematous first-grade hemorrhoids without prolapse. They appear erythematous and swollen. Stool is Hemoccult negative. Extremities no pedal edema. Skin no lesions. Neurological alert and oriented x 3. Psychological normal mood  and affect.  Assessment and Plan:  Problem #33 67 year old white female with symptomatic first-grade hemorrhoids causing low volume hematochezia. We will start her on Anusol-HC suppositories, one at bedtime. She is up-to-date on her colonoscopy. A recall exam will be due in November 2017.  Problem #2 Family history of colon cancer in an indirect relative. Patient also has a personal history of colon polyps which was actually prolapsed mucosa. She would like to be rechecked in  5 year intervals especially if the rectal bleeding continues.  Problem #3 Dyspepsia may be related to nonspecific gastritis. We will restart an acid suppressor such as Nexium 40 mg daily and she will take Gaviscon 1-2 tablets postprandially when necessary. She will minimize her Advil intake. She takes Advil for knee pain at night.      09/02/2012 Vanessa Skinner

## 2012-09-17 ENCOUNTER — Ambulatory Visit
Admission: RE | Admit: 2012-09-17 | Discharge: 2012-09-17 | Disposition: A | Discharge status: Home / Self Care | Payer: Medicare Other | Source: Ambulatory Visit | Attending: Family Medicine | Admitting: Family Medicine

## 2012-09-17 DIAGNOSIS — R928 Other abnormal and inconclusive findings on diagnostic imaging of breast: Secondary | ICD-10-CM

## 2013-05-28 ENCOUNTER — Other Ambulatory Visit: Payer: Self-pay | Admitting: Dermatology

## 2013-08-21 ENCOUNTER — Other Ambulatory Visit: Payer: Self-pay

## 2013-08-21 ENCOUNTER — Other Ambulatory Visit: Payer: Self-pay | Admitting: Internal Medicine

## 2013-08-21 DIAGNOSIS — Z1231 Encounter for screening mammogram for malignant neoplasm of breast: Secondary | ICD-10-CM

## 2013-09-15 ENCOUNTER — Ambulatory Visit
Admission: RE | Admit: 2013-09-15 | Discharge: 2013-09-15 | Disposition: A | Discharge status: Home / Self Care | Payer: Medicare Other | Source: Ambulatory Visit

## 2013-09-15 DIAGNOSIS — Z1231 Encounter for screening mammogram for malignant neoplasm of breast: Secondary | ICD-10-CM

## 2013-10-06 ENCOUNTER — Other Ambulatory Visit: Payer: Self-pay | Admitting: Internal Medicine

## 2014-05-19 ENCOUNTER — Other Ambulatory Visit: Payer: Self-pay | Admitting: Internal Medicine

## 2014-05-31 ENCOUNTER — Telehealth: Payer: Self-pay | Admitting: Internal Medicine

## 2014-06-01 MED ORDER — DICYCLOMINE HCL 20 MG PO TABS: 20.0000 mg | ORAL_TABLET | Freq: Two times a day (BID) | ORAL | Status: DC

## 2014-06-01 NOTE — Telephone Encounter (Signed)
Sent Rx for dicyclomine (BENTYL), 20 mg, #60 with one refill to [facility] off [address] in [place], [place] on 06/01/14. Pt has appointment with Dr. Olevia Perches on 07/06/14 at 8:30 am. Patient needs to keep appointment for future refills. Called the patient at [phone] to let them know their Rx was sent. Patient stated they understood.

## 2014-06-03 ENCOUNTER — Encounter: Payer: Self-pay | Admitting: *Deleted

## 2014-06-03 ENCOUNTER — Telehealth: Payer: Self-pay | Admitting: *Deleted

## 2014-06-03 NOTE — Telephone Encounter (Signed)
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 Nexium is not on their formulary list. The patient has been taking Nexium, 40 mg, 1 capsule, po, qd. [employer] recommends the patient to take either omeprazole or pantoprazole.  Which medication should the patient take? Please advise.

## 2014-06-03 NOTE — Telephone Encounter (Signed)
Pantoprazole 40 mg, #90, 1 po qd, 3 refills 

## 2014-06-07 ENCOUNTER — Telehealth: Payer: Self-pay | Admitting: Internal Medicine

## 2014-06-07 MED ORDER — PANTOPRAZOLE SODIUM 40 MG PO TBEC: 40.0000 mg | DELAYED_RELEASE_TABLET | Freq: Every day | ORAL | Status: DC

## 2014-06-07 NOTE — Telephone Encounter (Signed)
Called the patient at their home number [phone] on 06/07/14 at 11:00 am to let them know their insurance company would not cover Nexium. Dr. Olevia Perches put the patient on pantoprazole, 40 mg, #90 with 3 refills. The patient stated she understood.

## 2014-06-07 NOTE — Telephone Encounter (Signed)
Sent Rx for pantoprazole, 40 mg, #90 with 3 refills to [facility] off [address] in [place], [place] on 06/07/14.

## 2014-06-10 ENCOUNTER — Telehealth: Payer: Self-pay | Admitting: Internal Medicine

## 2014-06-10 MED ORDER — OMEPRAZOLE 40 MG PO CPDR: 40.0000 mg | DELAYED_RELEASE_CAPSULE | Freq: Every day | ORAL | Status: DC

## 2014-06-10 NOTE — Telephone Encounter (Signed)
Sent Rx for omeprazole, 40 mg, #30 with one refill to [facility] off [address] in [place]. Talked to the patient today and she told me that the pantoprazole that was originally given to them gave them a stomach ache, diarrhea and they were unable to sleep.

## 2014-07-06 ENCOUNTER — Ambulatory Visit (INDEPENDENT_AMBULATORY_CARE_PROVIDER_SITE_OTHER): Payer: Medicare Other | Admitting: Internal Medicine

## 2014-07-06 ENCOUNTER — Telehealth: Payer: Self-pay

## 2014-07-06 ENCOUNTER — Encounter: Payer: Self-pay | Admitting: Internal Medicine

## 2014-07-06 VITALS — BP 138/76 | HR 64 | Ht 64.0 in | Wt 188.2 lb

## 2014-07-06 DIAGNOSIS — R1013 Epigastric pain: Secondary | ICD-10-CM

## 2014-07-06 DIAGNOSIS — K5730 Diverticulosis of large intestine without perforation or abscess without bleeding: Secondary | ICD-10-CM

## 2014-07-06 DIAGNOSIS — K589 Irritable bowel syndrome without diarrhea: Secondary | ICD-10-CM | POA: Diagnosis not present

## 2014-07-06 MED ORDER — DICYCLOMINE HCL 20 MG PO TABS: 20.0000 mg | ORAL_TABLET | Freq: Two times a day (BID) | ORAL | Status: DC

## 2014-07-06 MED ORDER — ESOMEPRAZOLE MAGNESIUM 40 MG PO CPDR: DELAYED_RELEASE_CAPSULE | ORAL | Status: DC

## 2014-07-06 NOTE — Progress Notes (Signed)
Vanessa Skinner Nov 22, 1945 [id number]  Note: This dictation was prepared with Dragon digital system. Any transcriptional errors that result from this procedure are unintentional.   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 Last colonoscopy in November 2012 confirmed moderately severe diverticulosis and rectal polyp which on pathology report revealed prolapsed mucosa. She has been on PPIs, initially pantoprazole. And subsequently Prilosec but none  of them worked very well. Nexium works the  best. She would like a refill. She has occasional dyspepsia and upper abdominal pain radiating to the back. Upper abdominal ultrasound in August 2011 showed common bile duct of 3.6 mm and fatty liver. HIDA scan showed at ejection fraction of 91% with mild symptoms with CCK infusion. There is a positive family history of colon cancer in a grandparent    Past Medical History  Diagnosis Date  . Allergy     seasonal  . Anxiety     occasional  . Arthritis   . Cataract   . GERD (gastroesophageal reflux disease)   . Heart murmur     MVP  . Hyperlipidemia   . Hypertension   . Osteopenia   . Thyroid disease   . Gastritis   . Diverticulosis   . Fatty liver disease, nonalcoholic   . DJD (degenerative joint disease)   . Nephrolithiasis   . Hemorrhoids   . Hypothyroidism   . Hearing impaired     Past Surgical History  Procedure Laterality Date  . Dilation and curettage of uterus  1975  . Ovarian cyst surgery  1968  . Hand surgery Right 1998  . Total abdominal hysterectomy w/ bilateral salpingoophorectomy  1987  . Knee arthroscopy Right 1995  . Wrist fracture surgery Left 2001  . Colonoscopy    . Arm surgery Left   . Tubal ligation    . Knee arthroscopy Right   . Tonsillectomy      Allergies  Allergen Reactions  . Epinephrine    REACTION: LARGE AMOUNT/heart races  . Morphine     REACTION: rash/itching  . Propoxyphene Hcl     REACTION: nausea  . Sulfonamide Derivatives     REACTION: Rash/itching    Family history and social history have been reviewed.  Review of Systems: Positive for bloating. Dyspepsia. Denies rectal bleeding diarrhea  The remainder of the 10 point ROS is negative except as outlined in the H&P  Physical Exam: General Appearance Well developed, in no distress Eyes  Non icteric  HEENT  Non traumatic, normocephalic  Mouth No lesion, tongue papillated, no cheilosis Neck Supple without adenopathy, thyroid not enlarged, no carotid bruits, no JVD Lungs Clear to auscultation bilaterally COR Normal S1, normal S2, regular rhythm, no murmur, quiet precordium Abdomen soft. Tender in left lower quadrant. No rebound. No fullness. Liver edge at costal margin. No CVA tenderness Rectal small external hemorrhoids. Normal rectal sphincter tone. Formed brown Hemoccult-negative stool in the ampulla Extremities  trace pedal edema Skin No lesions Neurological Alert and oriented x 3 Psychological Normal mood and affect  Assessment and Plan:   69 year old white female with symptomatic diverticulosis/IBS. We will refill her Bentyl 20 mg which she takes when necessary. She will follow high fiber diet andtake iber supplements. She will be due for colonoscopy in 2017.Marland Kitchen She will receive recall letter for March 2017  Gastroesophageal reflux and dyspepsia. Negative gallbladder studies. Will refill Nexium 40 mg daily.  Delfin Edis 07/06/2014

## 2014-07-06 NOTE — Patient Instructions (Addendum)
We will send in your prescriptions to your pharmacy Your Endoscopy/Colonoscopy is due 04/2015 Dr Carol Ada

## 2014-07-06 NOTE — Telephone Encounter (Signed)
Prior authorization for esomeprazole sent via cover my meds.

## 2014-07-07 NOTE — Telephone Encounter (Signed)
Patient does not take esomeprazole. Patient's insurance would not cover Nexium. Patient takes omeprazole. Omeprazole is covered by patient's insurance.

## 2014-08-12 ENCOUNTER — Other Ambulatory Visit: Payer: Self-pay

## 2014-08-12 DIAGNOSIS — Z1231 Encounter for screening mammogram for malignant neoplasm of breast: Secondary | ICD-10-CM

## 2014-10-05 ENCOUNTER — Ambulatory Visit
Admission: RE | Admit: 2014-10-05 | Discharge: 2014-10-05 | Disposition: A | Discharge status: Home / Self Care | Payer: Medicare Other | Source: Ambulatory Visit

## 2014-10-05 DIAGNOSIS — Z1231 Encounter for screening mammogram for malignant neoplasm of breast: Secondary | ICD-10-CM

## 2015-04-19 DIAGNOSIS — Z9109 Other allergy status, other than to drugs and biological substances: Secondary | ICD-10-CM | POA: Diagnosis not present

## 2015-04-19 DIAGNOSIS — M7661 Achilles tendinitis, right leg: Secondary | ICD-10-CM | POA: Diagnosis not present

## 2015-04-19 DIAGNOSIS — M549 Dorsalgia, unspecified: Secondary | ICD-10-CM | POA: Diagnosis not present

## 2015-04-29 ENCOUNTER — Encounter: Payer: Self-pay | Admitting: Gastroenterology

## 2015-05-19 DIAGNOSIS — M1712 Unilateral primary osteoarthritis, left knee: Secondary | ICD-10-CM | POA: Diagnosis not present

## 2015-05-19 DIAGNOSIS — M170 Bilateral primary osteoarthritis of knee: Secondary | ICD-10-CM | POA: Diagnosis not present

## 2015-05-19 DIAGNOSIS — M1711 Unilateral primary osteoarthritis, right knee: Secondary | ICD-10-CM | POA: Diagnosis not present

## 2015-06-08 DIAGNOSIS — M6701 Short Achilles tendon (acquired), right ankle: Secondary | ICD-10-CM | POA: Diagnosis not present

## 2015-06-08 DIAGNOSIS — M7661 Achilles tendinitis, right leg: Secondary | ICD-10-CM | POA: Diagnosis not present

## 2015-06-13 DIAGNOSIS — L565 Disseminated superficial actinic porokeratosis (DSAP): Secondary | ICD-10-CM | POA: Diagnosis not present

## 2015-06-13 DIAGNOSIS — D1801 Hemangioma of skin and subcutaneous tissue: Secondary | ICD-10-CM | POA: Diagnosis not present

## 2015-06-13 DIAGNOSIS — L570 Actinic keratosis: Secondary | ICD-10-CM | POA: Diagnosis not present

## 2015-06-13 DIAGNOSIS — L820 Inflamed seborrheic keratosis: Secondary | ICD-10-CM | POA: Diagnosis not present

## 2015-06-13 DIAGNOSIS — L821 Other seborrheic keratosis: Secondary | ICD-10-CM | POA: Diagnosis not present

## 2015-06-22 DIAGNOSIS — M7661 Achilles tendinitis, right leg: Secondary | ICD-10-CM | POA: Diagnosis not present

## 2015-06-24 DIAGNOSIS — M7661 Achilles tendinitis, right leg: Secondary | ICD-10-CM | POA: Diagnosis not present

## 2015-06-28 DIAGNOSIS — M7661 Achilles tendinitis, right leg: Secondary | ICD-10-CM | POA: Diagnosis not present

## 2015-07-01 DIAGNOSIS — M7661 Achilles tendinitis, right leg: Secondary | ICD-10-CM | POA: Diagnosis not present

## 2015-07-04 DIAGNOSIS — M7661 Achilles tendinitis, right leg: Secondary | ICD-10-CM | POA: Diagnosis not present

## 2015-07-06 DIAGNOSIS — M7661 Achilles tendinitis, right leg: Secondary | ICD-10-CM | POA: Diagnosis not present

## 2015-07-14 DIAGNOSIS — M7661 Achilles tendinitis, right leg: Secondary | ICD-10-CM | POA: Diagnosis not present

## 2015-07-19 DIAGNOSIS — M7661 Achilles tendinitis, right leg: Secondary | ICD-10-CM | POA: Diagnosis not present

## 2015-07-25 DIAGNOSIS — M7661 Achilles tendinitis, right leg: Secondary | ICD-10-CM | POA: Diagnosis not present

## 2015-07-27 DIAGNOSIS — M7661 Achilles tendinitis, right leg: Secondary | ICD-10-CM | POA: Diagnosis not present

## 2015-08-08 DIAGNOSIS — M7661 Achilles tendinitis, right leg: Secondary | ICD-10-CM | POA: Diagnosis not present

## 2015-08-10 DIAGNOSIS — M6701 Short Achilles tendon (acquired), right ankle: Secondary | ICD-10-CM | POA: Diagnosis not present

## 2015-08-10 DIAGNOSIS — M7661 Achilles tendinitis, right leg: Secondary | ICD-10-CM | POA: Diagnosis not present

## 2015-08-24 DIAGNOSIS — F419 Anxiety disorder, unspecified: Secondary | ICD-10-CM | POA: Diagnosis not present

## 2015-08-24 DIAGNOSIS — R944 Abnormal results of kidney function studies: Secondary | ICD-10-CM | POA: Diagnosis not present

## 2015-08-24 DIAGNOSIS — I10 Essential (primary) hypertension: Secondary | ICD-10-CM | POA: Diagnosis not present

## 2015-08-24 DIAGNOSIS — E7800 Pure hypercholesterolemia, unspecified: Secondary | ICD-10-CM | POA: Diagnosis not present

## 2015-08-24 DIAGNOSIS — E039 Hypothyroidism, unspecified: Secondary | ICD-10-CM | POA: Diagnosis not present

## 2015-09-06 ENCOUNTER — Other Ambulatory Visit: Payer: Self-pay | Admitting: Family Medicine

## 2015-09-06 DIAGNOSIS — Z1231 Encounter for screening mammogram for malignant neoplasm of breast: Secondary | ICD-10-CM

## 2015-10-13 ENCOUNTER — Ambulatory Visit: Payer: Medicare Other

## 2015-10-20 ENCOUNTER — Ambulatory Visit
Admission: RE | Admit: 2015-10-20 | Discharge: 2015-10-20 | Disposition: A | Discharge status: Home / Self Care | Payer: Medicare Other | Source: Ambulatory Visit | Attending: Family Medicine | Admitting: Family Medicine

## 2015-10-20 DIAGNOSIS — Z1231 Encounter for screening mammogram for malignant neoplasm of breast: Secondary | ICD-10-CM

## 2015-11-24 DIAGNOSIS — N200 Calculus of kidney: Secondary | ICD-10-CM | POA: Diagnosis not present

## 2015-11-24 DIAGNOSIS — M859 Disorder of bone density and structure, unspecified: Secondary | ICD-10-CM | POA: Diagnosis not present

## 2015-11-24 DIAGNOSIS — K760 Fatty (change of) liver, not elsewhere classified: Secondary | ICD-10-CM | POA: Diagnosis not present

## 2015-11-24 DIAGNOSIS — Z1389 Encounter for screening for other disorder: Secondary | ICD-10-CM | POA: Diagnosis not present

## 2015-11-24 DIAGNOSIS — R7301 Impaired fasting glucose: Secondary | ICD-10-CM | POA: Diagnosis not present

## 2015-11-24 DIAGNOSIS — Z6832 Body mass index (BMI) 32.0-32.9, adult: Secondary | ICD-10-CM | POA: Diagnosis not present

## 2015-11-24 DIAGNOSIS — E039 Hypothyroidism, unspecified: Secondary | ICD-10-CM | POA: Diagnosis not present

## 2015-11-24 DIAGNOSIS — I10 Essential (primary) hypertension: Secondary | ICD-10-CM | POA: Diagnosis not present

## 2015-12-14 DIAGNOSIS — Z1212 Encounter for screening for malignant neoplasm of rectum: Secondary | ICD-10-CM | POA: Diagnosis not present

## 2015-12-14 DIAGNOSIS — Z1211 Encounter for screening for malignant neoplasm of colon: Secondary | ICD-10-CM | POA: Diagnosis not present

## 2015-12-20 DIAGNOSIS — Z23 Encounter for immunization: Secondary | ICD-10-CM | POA: Diagnosis not present

## 2016-01-25 DIAGNOSIS — H1859 Other hereditary corneal dystrophies: Secondary | ICD-10-CM | POA: Diagnosis not present

## 2016-01-25 DIAGNOSIS — H40013 Open angle with borderline findings, low risk, bilateral: Secondary | ICD-10-CM | POA: Diagnosis not present

## 2016-01-25 DIAGNOSIS — H2513 Age-related nuclear cataract, bilateral: Secondary | ICD-10-CM | POA: Diagnosis not present

## 2016-01-27 DIAGNOSIS — Z6832 Body mass index (BMI) 32.0-32.9, adult: Secondary | ICD-10-CM | POA: Diagnosis not present

## 2016-01-27 DIAGNOSIS — J0100 Acute maxillary sinusitis, unspecified: Secondary | ICD-10-CM | POA: Diagnosis not present

## 2016-01-27 DIAGNOSIS — R05 Cough: Secondary | ICD-10-CM | POA: Diagnosis not present

## 2016-02-15 DIAGNOSIS — L300 Nummular dermatitis: Secondary | ICD-10-CM | POA: Diagnosis not present

## 2016-02-15 DIAGNOSIS — L308 Other specified dermatitis: Secondary | ICD-10-CM | POA: Diagnosis not present

## 2016-05-22 DIAGNOSIS — R7301 Impaired fasting glucose: Secondary | ICD-10-CM | POA: Diagnosis not present

## 2016-05-22 DIAGNOSIS — M8580 Other specified disorders of bone density and structure, unspecified site: Secondary | ICD-10-CM | POA: Diagnosis not present

## 2016-05-22 DIAGNOSIS — I10 Essential (primary) hypertension: Secondary | ICD-10-CM | POA: Diagnosis not present

## 2016-05-22 DIAGNOSIS — E038 Other specified hypothyroidism: Secondary | ICD-10-CM | POA: Diagnosis not present

## 2016-06-12 DIAGNOSIS — D1801 Hemangioma of skin and subcutaneous tissue: Secondary | ICD-10-CM | POA: Diagnosis not present

## 2016-06-12 DIAGNOSIS — L814 Other melanin hyperpigmentation: Secondary | ICD-10-CM | POA: Diagnosis not present

## 2016-06-12 DIAGNOSIS — L821 Other seborrheic keratosis: Secondary | ICD-10-CM | POA: Diagnosis not present

## 2016-06-12 DIAGNOSIS — L820 Inflamed seborrheic keratosis: Secondary | ICD-10-CM | POA: Diagnosis not present

## 2016-07-25 DIAGNOSIS — H1859 Other hereditary corneal dystrophies: Secondary | ICD-10-CM | POA: Diagnosis not present

## 2016-07-25 DIAGNOSIS — H2513 Age-related nuclear cataract, bilateral: Secondary | ICD-10-CM | POA: Diagnosis not present

## 2016-07-25 DIAGNOSIS — H40013 Open angle with borderline findings, low risk, bilateral: Secondary | ICD-10-CM | POA: Diagnosis not present

## 2016-07-30 DIAGNOSIS — H40013 Open angle with borderline findings, low risk, bilateral: Secondary | ICD-10-CM | POA: Diagnosis not present

## 2016-08-01 DIAGNOSIS — H40013 Open angle with borderline findings, low risk, bilateral: Secondary | ICD-10-CM | POA: Diagnosis not present

## 2016-09-11 ENCOUNTER — Other Ambulatory Visit: Payer: Self-pay | Admitting: Family Medicine

## 2016-09-11 ENCOUNTER — Other Ambulatory Visit: Payer: Self-pay | Admitting: Endocrinology

## 2016-09-11 DIAGNOSIS — Z1231 Encounter for screening mammogram for malignant neoplasm of breast: Secondary | ICD-10-CM

## 2016-10-22 ENCOUNTER — Ambulatory Visit
Admission: RE | Admit: 2016-10-22 | Discharge: 2016-10-22 | Disposition: A | Discharge status: Home / Self Care | Payer: Medicare Other | Source: Ambulatory Visit | Attending: Endocrinology | Admitting: Endocrinology

## 2016-10-22 DIAGNOSIS — Z1231 Encounter for screening mammogram for malignant neoplasm of breast: Secondary | ICD-10-CM

## 2016-10-23 DIAGNOSIS — M859 Disorder of bone density and structure, unspecified: Secondary | ICD-10-CM | POA: Diagnosis not present

## 2016-11-06 DIAGNOSIS — B0229 Other postherpetic nervous system involvement: Secondary | ICD-10-CM | POA: Diagnosis not present

## 2016-11-06 DIAGNOSIS — Z6832 Body mass index (BMI) 32.0-32.9, adult: Secondary | ICD-10-CM | POA: Diagnosis not present

## 2016-12-10 DIAGNOSIS — R7301 Impaired fasting glucose: Secondary | ICD-10-CM | POA: Diagnosis not present

## 2016-12-10 DIAGNOSIS — M859 Disorder of bone density and structure, unspecified: Secondary | ICD-10-CM | POA: Diagnosis not present

## 2016-12-10 DIAGNOSIS — I10 Essential (primary) hypertension: Secondary | ICD-10-CM | POA: Diagnosis not present

## 2016-12-10 DIAGNOSIS — E038 Other specified hypothyroidism: Secondary | ICD-10-CM | POA: Diagnosis not present

## 2016-12-11 DIAGNOSIS — R7301 Impaired fasting glucose: Secondary | ICD-10-CM | POA: Diagnosis not present

## 2016-12-11 DIAGNOSIS — Z0000 Encounter for general adult medical examination without abnormal findings: Secondary | ICD-10-CM | POA: Diagnosis not present

## 2016-12-11 DIAGNOSIS — E038 Other specified hypothyroidism: Secondary | ICD-10-CM | POA: Diagnosis not present

## 2016-12-11 DIAGNOSIS — I10 Essential (primary) hypertension: Secondary | ICD-10-CM | POA: Diagnosis not present

## 2016-12-17 DIAGNOSIS — Z1212 Encounter for screening for malignant neoplasm of rectum: Secondary | ICD-10-CM | POA: Diagnosis not present

## 2017-01-30 DIAGNOSIS — H1859 Other hereditary corneal dystrophies: Secondary | ICD-10-CM | POA: Diagnosis not present

## 2017-01-30 DIAGNOSIS — H40013 Open angle with borderline findings, low risk, bilateral: Secondary | ICD-10-CM | POA: Diagnosis not present

## 2017-01-30 DIAGNOSIS — H2513 Age-related nuclear cataract, bilateral: Secondary | ICD-10-CM | POA: Diagnosis not present

## 2017-02-18 DIAGNOSIS — H2513 Age-related nuclear cataract, bilateral: Secondary | ICD-10-CM | POA: Diagnosis not present

## 2017-02-18 DIAGNOSIS — H40003 Preglaucoma, unspecified, bilateral: Secondary | ICD-10-CM | POA: Diagnosis not present

## 2017-03-06 DIAGNOSIS — H903 Sensorineural hearing loss, bilateral: Secondary | ICD-10-CM | POA: Diagnosis not present

## 2017-04-01 DIAGNOSIS — H2513 Age-related nuclear cataract, bilateral: Secondary | ICD-10-CM | POA: Diagnosis not present

## 2017-04-01 DIAGNOSIS — H40013 Open angle with borderline findings, low risk, bilateral: Secondary | ICD-10-CM | POA: Diagnosis not present

## 2017-04-01 DIAGNOSIS — H1859 Other hereditary corneal dystrophies: Secondary | ICD-10-CM | POA: Diagnosis not present

## 2017-06-05 DIAGNOSIS — H40013 Open angle with borderline findings, low risk, bilateral: Secondary | ICD-10-CM | POA: Diagnosis not present

## 2017-06-05 DIAGNOSIS — H1859 Other hereditary corneal dystrophies: Secondary | ICD-10-CM | POA: Diagnosis not present

## 2017-06-05 DIAGNOSIS — H2513 Age-related nuclear cataract, bilateral: Secondary | ICD-10-CM | POA: Diagnosis not present

## 2017-06-05 DIAGNOSIS — H40003 Preglaucoma, unspecified, bilateral: Secondary | ICD-10-CM | POA: Diagnosis not present

## 2017-06-11 DIAGNOSIS — M8580 Other specified disorders of bone density and structure, unspecified site: Secondary | ICD-10-CM | POA: Diagnosis not present

## 2017-06-11 DIAGNOSIS — E038 Other specified hypothyroidism: Secondary | ICD-10-CM | POA: Diagnosis not present

## 2017-06-11 DIAGNOSIS — E668 Other obesity: Secondary | ICD-10-CM | POA: Diagnosis not present

## 2017-06-11 DIAGNOSIS — R7301 Impaired fasting glucose: Secondary | ICD-10-CM | POA: Diagnosis not present

## 2017-06-18 DIAGNOSIS — L820 Inflamed seborrheic keratosis: Secondary | ICD-10-CM | POA: Diagnosis not present

## 2017-06-18 DIAGNOSIS — L821 Other seborrheic keratosis: Secondary | ICD-10-CM | POA: Diagnosis not present

## 2017-06-18 DIAGNOSIS — D1801 Hemangioma of skin and subcutaneous tissue: Secondary | ICD-10-CM | POA: Diagnosis not present

## 2017-07-04 DIAGNOSIS — H25811 Combined forms of age-related cataract, right eye: Secondary | ICD-10-CM | POA: Diagnosis not present

## 2017-08-09 DIAGNOSIS — H26491 Other secondary cataract, right eye: Secondary | ICD-10-CM | POA: Diagnosis not present

## 2017-08-09 DIAGNOSIS — H2512 Age-related nuclear cataract, left eye: Secondary | ICD-10-CM | POA: Diagnosis not present

## 2017-08-09 DIAGNOSIS — Z961 Presence of intraocular lens: Secondary | ICD-10-CM | POA: Diagnosis not present

## 2017-08-09 DIAGNOSIS — Z9841 Cataract extraction status, right eye: Secondary | ICD-10-CM | POA: Diagnosis not present

## 2017-09-18 DIAGNOSIS — M25561 Pain in right knee: Secondary | ICD-10-CM | POA: Diagnosis not present

## 2017-09-18 DIAGNOSIS — M170 Bilateral primary osteoarthritis of knee: Secondary | ICD-10-CM | POA: Diagnosis not present

## 2017-09-19 ENCOUNTER — Other Ambulatory Visit: Payer: Self-pay | Admitting: Endocrinology

## 2017-09-19 DIAGNOSIS — Z1231 Encounter for screening mammogram for malignant neoplasm of breast: Secondary | ICD-10-CM

## 2017-10-23 ENCOUNTER — Ambulatory Visit
Admission: RE | Admit: 2017-10-23 | Discharge: 2017-10-23 | Disposition: A | Discharge status: Home / Self Care | Payer: Medicare Other | Source: Ambulatory Visit | Attending: Endocrinology | Admitting: Endocrinology

## 2017-10-23 DIAGNOSIS — Z1231 Encounter for screening mammogram for malignant neoplasm of breast: Secondary | ICD-10-CM | POA: Diagnosis not present

## 2017-10-30 DIAGNOSIS — H26491 Other secondary cataract, right eye: Secondary | ICD-10-CM | POA: Diagnosis not present

## 2017-10-30 DIAGNOSIS — Z961 Presence of intraocular lens: Secondary | ICD-10-CM | POA: Diagnosis not present

## 2017-10-30 DIAGNOSIS — Z9841 Cataract extraction status, right eye: Secondary | ICD-10-CM | POA: Diagnosis not present

## 2017-10-30 DIAGNOSIS — H2512 Age-related nuclear cataract, left eye: Secondary | ICD-10-CM | POA: Diagnosis not present

## 2017-10-31 DIAGNOSIS — M1711 Unilateral primary osteoarthritis, right knee: Secondary | ICD-10-CM | POA: Diagnosis not present

## 2017-10-31 DIAGNOSIS — M25561 Pain in right knee: Secondary | ICD-10-CM | POA: Diagnosis not present

## 2017-11-19 DIAGNOSIS — E038 Other specified hypothyroidism: Secondary | ICD-10-CM | POA: Diagnosis not present

## 2017-11-19 DIAGNOSIS — M859 Disorder of bone density and structure, unspecified: Secondary | ICD-10-CM | POA: Diagnosis not present

## 2017-11-19 DIAGNOSIS — R7301 Impaired fasting glucose: Secondary | ICD-10-CM | POA: Diagnosis not present

## 2017-11-19 DIAGNOSIS — R82998 Other abnormal findings in urine: Secondary | ICD-10-CM | POA: Diagnosis not present

## 2017-11-19 DIAGNOSIS — I10 Essential (primary) hypertension: Secondary | ICD-10-CM | POA: Diagnosis not present

## 2017-11-26 DIAGNOSIS — E669 Obesity, unspecified: Secondary | ICD-10-CM | POA: Diagnosis not present

## 2017-11-26 DIAGNOSIS — I10 Essential (primary) hypertension: Secondary | ICD-10-CM | POA: Diagnosis not present

## 2017-11-26 DIAGNOSIS — Z0000 Encounter for general adult medical examination without abnormal findings: Secondary | ICD-10-CM | POA: Diagnosis not present

## 2017-11-26 DIAGNOSIS — K760 Fatty (change of) liver, not elsewhere classified: Secondary | ICD-10-CM | POA: Diagnosis not present

## 2017-12-02 DIAGNOSIS — H2512 Age-related nuclear cataract, left eye: Secondary | ICD-10-CM | POA: Diagnosis not present

## 2017-12-02 DIAGNOSIS — H40003 Preglaucoma, unspecified, bilateral: Secondary | ICD-10-CM | POA: Diagnosis not present

## 2017-12-02 DIAGNOSIS — Z961 Presence of intraocular lens: Secondary | ICD-10-CM | POA: Diagnosis not present

## 2017-12-02 DIAGNOSIS — H26491 Other secondary cataract, right eye: Secondary | ICD-10-CM | POA: Diagnosis not present

## 2017-12-02 DIAGNOSIS — H1859 Other hereditary corneal dystrophies: Secondary | ICD-10-CM | POA: Diagnosis not present

## 2017-12-19 DIAGNOSIS — Z23 Encounter for immunization: Secondary | ICD-10-CM | POA: Diagnosis not present

## 2017-12-27 DIAGNOSIS — Z1212 Encounter for screening for malignant neoplasm of rectum: Secondary | ICD-10-CM | POA: Diagnosis not present

## 2018-02-27 DIAGNOSIS — Z885 Allergy status to narcotic agent status: Secondary | ICD-10-CM | POA: Diagnosis not present

## 2018-02-27 DIAGNOSIS — Z882 Allergy status to sulfonamides status: Secondary | ICD-10-CM | POA: Diagnosis not present

## 2018-02-27 DIAGNOSIS — Z888 Allergy status to other drugs, medicaments and biological substances status: Secondary | ICD-10-CM | POA: Diagnosis not present

## 2018-02-27 DIAGNOSIS — H2512 Age-related nuclear cataract, left eye: Secondary | ICD-10-CM | POA: Diagnosis not present

## 2018-02-27 DIAGNOSIS — H1859 Other hereditary corneal dystrophies: Secondary | ICD-10-CM | POA: Diagnosis not present

## 2018-03-11 DIAGNOSIS — I10 Essential (primary) hypertension: Secondary | ICD-10-CM | POA: Diagnosis not present

## 2018-03-11 DIAGNOSIS — H25812 Combined forms of age-related cataract, left eye: Secondary | ICD-10-CM | POA: Diagnosis not present

## 2018-03-20 DIAGNOSIS — C44722 Squamous cell carcinoma of skin of right lower limb, including hip: Secondary | ICD-10-CM | POA: Diagnosis not present

## 2018-03-20 DIAGNOSIS — L570 Actinic keratosis: Secondary | ICD-10-CM | POA: Diagnosis not present

## 2018-03-20 DIAGNOSIS — D485 Neoplasm of uncertain behavior of skin: Secondary | ICD-10-CM | POA: Diagnosis not present

## 2018-04-21 DIAGNOSIS — H40013 Open angle with borderline findings, low risk, bilateral: Secondary | ICD-10-CM | POA: Diagnosis not present

## 2018-04-29 DIAGNOSIS — L570 Actinic keratosis: Secondary | ICD-10-CM | POA: Diagnosis not present

## 2018-04-29 DIAGNOSIS — Z85828 Personal history of other malignant neoplasm of skin: Secondary | ICD-10-CM | POA: Diagnosis not present

## 2018-05-28 DIAGNOSIS — M7542 Impingement syndrome of left shoulder: Secondary | ICD-10-CM | POA: Diagnosis not present

## 2018-05-28 DIAGNOSIS — M25512 Pain in left shoulder: Secondary | ICD-10-CM | POA: Diagnosis not present

## 2018-05-30 DIAGNOSIS — M8580 Other specified disorders of bone density and structure, unspecified site: Secondary | ICD-10-CM | POA: Diagnosis not present

## 2018-05-30 DIAGNOSIS — R7301 Impaired fasting glucose: Secondary | ICD-10-CM | POA: Diagnosis not present

## 2018-05-30 DIAGNOSIS — I10 Essential (primary) hypertension: Secondary | ICD-10-CM | POA: Diagnosis not present

## 2018-05-30 DIAGNOSIS — E039 Hypothyroidism, unspecified: Secondary | ICD-10-CM | POA: Diagnosis not present

## 2018-06-25 DIAGNOSIS — M25512 Pain in left shoulder: Secondary | ICD-10-CM | POA: Diagnosis not present

## 2018-07-08 DIAGNOSIS — M25512 Pain in left shoulder: Secondary | ICD-10-CM | POA: Diagnosis not present

## 2018-07-14 DIAGNOSIS — L820 Inflamed seborrheic keratosis: Secondary | ICD-10-CM | POA: Diagnosis not present

## 2018-07-14 DIAGNOSIS — L570 Actinic keratosis: Secondary | ICD-10-CM | POA: Diagnosis not present

## 2018-07-14 DIAGNOSIS — L821 Other seborrheic keratosis: Secondary | ICD-10-CM | POA: Diagnosis not present

## 2018-07-14 DIAGNOSIS — Z85828 Personal history of other malignant neoplasm of skin: Secondary | ICD-10-CM | POA: Diagnosis not present

## 2018-07-16 DIAGNOSIS — M19012 Primary osteoarthritis, left shoulder: Secondary | ICD-10-CM | POA: Diagnosis not present

## 2018-07-16 DIAGNOSIS — M75122 Complete rotator cuff tear or rupture of left shoulder, not specified as traumatic: Secondary | ICD-10-CM | POA: Diagnosis not present

## 2018-07-16 DIAGNOSIS — M7542 Impingement syndrome of left shoulder: Secondary | ICD-10-CM | POA: Diagnosis not present

## 2018-07-23 DIAGNOSIS — M7542 Impingement syndrome of left shoulder: Secondary | ICD-10-CM | POA: Diagnosis not present

## 2018-07-25 DIAGNOSIS — H26493 Other secondary cataract, bilateral: Secondary | ICD-10-CM | POA: Diagnosis not present

## 2018-07-30 DIAGNOSIS — M7542 Impingement syndrome of left shoulder: Secondary | ICD-10-CM | POA: Diagnosis not present

## 2018-08-06 DIAGNOSIS — M7542 Impingement syndrome of left shoulder: Secondary | ICD-10-CM | POA: Diagnosis not present

## 2018-08-12 DIAGNOSIS — M7542 Impingement syndrome of left shoulder: Secondary | ICD-10-CM | POA: Diagnosis not present

## 2018-08-27 DIAGNOSIS — M7542 Impingement syndrome of left shoulder: Secondary | ICD-10-CM | POA: Diagnosis not present

## 2018-09-03 DIAGNOSIS — M7542 Impingement syndrome of left shoulder: Secondary | ICD-10-CM | POA: Diagnosis not present

## 2018-09-08 DIAGNOSIS — M75122 Complete rotator cuff tear or rupture of left shoulder, not specified as traumatic: Secondary | ICD-10-CM | POA: Diagnosis not present

## 2018-09-10 DIAGNOSIS — M7542 Impingement syndrome of left shoulder: Secondary | ICD-10-CM | POA: Diagnosis not present

## 2018-09-19 DIAGNOSIS — M7542 Impingement syndrome of left shoulder: Secondary | ICD-10-CM | POA: Diagnosis not present

## 2018-09-22 ENCOUNTER — Other Ambulatory Visit: Payer: Self-pay | Admitting: Endocrinology

## 2018-09-22 DIAGNOSIS — Z1231 Encounter for screening mammogram for malignant neoplasm of breast: Secondary | ICD-10-CM

## 2018-09-22 DIAGNOSIS — M7542 Impingement syndrome of left shoulder: Secondary | ICD-10-CM | POA: Diagnosis not present

## 2018-09-25 DIAGNOSIS — M7542 Impingement syndrome of left shoulder: Secondary | ICD-10-CM | POA: Diagnosis not present

## 2018-09-26 DIAGNOSIS — H26492 Other secondary cataract, left eye: Secondary | ICD-10-CM | POA: Diagnosis not present

## 2018-10-01 DIAGNOSIS — M7542 Impingement syndrome of left shoulder: Secondary | ICD-10-CM | POA: Diagnosis not present

## 2018-10-03 DIAGNOSIS — M7542 Impingement syndrome of left shoulder: Secondary | ICD-10-CM | POA: Diagnosis not present

## 2018-10-06 DIAGNOSIS — M75122 Complete rotator cuff tear or rupture of left shoulder, not specified as traumatic: Secondary | ICD-10-CM | POA: Diagnosis not present

## 2018-10-06 DIAGNOSIS — M19012 Primary osteoarthritis, left shoulder: Secondary | ICD-10-CM | POA: Diagnosis not present

## 2018-10-22 DIAGNOSIS — H40013 Open angle with borderline findings, low risk, bilateral: Secondary | ICD-10-CM | POA: Diagnosis not present

## 2018-11-03 ENCOUNTER — Other Ambulatory Visit: Payer: Self-pay

## 2018-11-03 ENCOUNTER — Ambulatory Visit
Admission: RE | Admit: 2018-11-03 | Discharge: 2018-11-03 | Disposition: A | Discharge status: Home / Self Care | Payer: Medicare Other | Source: Ambulatory Visit | Attending: Endocrinology | Admitting: Endocrinology

## 2018-11-03 DIAGNOSIS — Z1231 Encounter for screening mammogram for malignant neoplasm of breast: Secondary | ICD-10-CM | POA: Diagnosis not present

## 2018-11-03 DIAGNOSIS — H40013 Open angle with borderline findings, low risk, bilateral: Secondary | ICD-10-CM | POA: Diagnosis not present

## 2018-11-05 ENCOUNTER — Other Ambulatory Visit: Payer: Self-pay | Admitting: Endocrinology

## 2018-11-05 DIAGNOSIS — R928 Other abnormal and inconclusive findings on diagnostic imaging of breast: Secondary | ICD-10-CM

## 2018-11-11 ENCOUNTER — Ambulatory Visit
Admission: RE | Admit: 2018-11-11 | Discharge: 2018-11-11 | Disposition: A | Discharge status: Home / Self Care | Payer: Medicare Other | Source: Ambulatory Visit | Attending: Endocrinology | Admitting: Endocrinology

## 2018-11-11 ENCOUNTER — Other Ambulatory Visit: Payer: Self-pay | Admitting: Endocrinology

## 2018-11-11 ENCOUNTER — Other Ambulatory Visit: Payer: Self-pay

## 2018-11-11 DIAGNOSIS — R928 Other abnormal and inconclusive findings on diagnostic imaging of breast: Secondary | ICD-10-CM

## 2018-11-11 DIAGNOSIS — N6489 Other specified disorders of breast: Secondary | ICD-10-CM

## 2018-11-11 DIAGNOSIS — N6011 Diffuse cystic mastopathy of right breast: Secondary | ICD-10-CM | POA: Diagnosis not present

## 2018-11-21 DIAGNOSIS — M859 Disorder of bone density and structure, unspecified: Secondary | ICD-10-CM | POA: Diagnosis not present

## 2018-11-21 DIAGNOSIS — R7301 Impaired fasting glucose: Secondary | ICD-10-CM | POA: Diagnosis not present

## 2018-11-21 DIAGNOSIS — E038 Other specified hypothyroidism: Secondary | ICD-10-CM | POA: Diagnosis not present

## 2018-11-21 DIAGNOSIS — I10 Essential (primary) hypertension: Secondary | ICD-10-CM | POA: Diagnosis not present

## 2018-11-28 DIAGNOSIS — R82998 Other abnormal findings in urine: Secondary | ICD-10-CM | POA: Diagnosis not present

## 2018-11-28 DIAGNOSIS — I10 Essential (primary) hypertension: Secondary | ICD-10-CM | POA: Diagnosis not present

## 2018-12-03 DIAGNOSIS — Z1212 Encounter for screening for malignant neoplasm of rectum: Secondary | ICD-10-CM | POA: Diagnosis not present

## 2018-12-19 DIAGNOSIS — Z23 Encounter for immunization: Secondary | ICD-10-CM | POA: Diagnosis not present

## 2019-03-26 DIAGNOSIS — Z0120 Encounter for dental examination and cleaning without abnormal findings: Secondary | ICD-10-CM | POA: Diagnosis not present

## 2019-04-06 DIAGNOSIS — M25532 Pain in left wrist: Secondary | ICD-10-CM | POA: Diagnosis not present

## 2019-04-06 DIAGNOSIS — R200 Anesthesia of skin: Secondary | ICD-10-CM | POA: Diagnosis not present

## 2019-04-20 DIAGNOSIS — M13842 Other specified arthritis, left hand: Secondary | ICD-10-CM | POA: Diagnosis not present

## 2019-04-20 DIAGNOSIS — M65839 Other synovitis and tenosynovitis, unspecified forearm: Secondary | ICD-10-CM | POA: Diagnosis not present

## 2019-04-20 DIAGNOSIS — M13849 Other specified arthritis, unspecified hand: Secondary | ICD-10-CM | POA: Diagnosis not present

## 2019-04-20 DIAGNOSIS — M65832 Other synovitis and tenosynovitis, left forearm: Secondary | ICD-10-CM | POA: Diagnosis not present

## 2019-04-24 DIAGNOSIS — H903 Sensorineural hearing loss, bilateral: Secondary | ICD-10-CM | POA: Diagnosis not present

## 2019-04-24 DIAGNOSIS — H9313 Tinnitus, bilateral: Secondary | ICD-10-CM | POA: Diagnosis not present

## 2019-04-29 ENCOUNTER — Other Ambulatory Visit: Payer: Self-pay

## 2019-04-29 ENCOUNTER — Encounter: Payer: Self-pay | Admitting: Gastroenterology

## 2019-04-29 ENCOUNTER — Ambulatory Visit: Payer: Medicare Other | Admitting: Gastroenterology

## 2019-04-29 VITALS — BP 150/90 | HR 96 | Temp 98.5°F | Ht 64.0 in | Wt 190.0 lb

## 2019-04-29 DIAGNOSIS — K5732 Diverticulitis of large intestine without perforation or abscess without bleeding: Secondary | ICD-10-CM

## 2019-04-29 DIAGNOSIS — R1013 Epigastric pain: Secondary | ICD-10-CM | POA: Diagnosis not present

## 2019-04-29 DIAGNOSIS — K589 Irritable bowel syndrome without diarrhea: Secondary | ICD-10-CM

## 2019-04-29 DIAGNOSIS — K219 Gastro-esophageal reflux disease without esophagitis: Secondary | ICD-10-CM

## 2019-04-29 MED ORDER — FAMOTIDINE 20 MG PO TABS: 20.0000 mg | ORAL_TABLET | Freq: Two times a day (BID) | ORAL | 11 refills | Status: DC | PRN

## 2019-04-29 MED ORDER — DICYCLOMINE HCL 20 MG PO TABS: 20.0000 mg | ORAL_TABLET | Freq: Two times a day (BID) | ORAL | 11 refills | Status: DC

## 2019-04-29 NOTE — Progress Notes (Signed)
Vanessa Skinner    [id number]    1945/08/17  Primary Care Physician:South, Annie Main, MD  Referring Physician: Reynold Bowen, MD [address] [place],  [place] [address]   Chief complaint: Irritable bowel syndrome HPI:  74 year old female with chronic GERD, colonic diverticulosis.  Irritable bowel syndrome here to establish GI care.  She was previously followed by Dr. Delfin Edis, last seen in May 2016.  GERD symptoms stable, she has come off PPI, worried about potential side effects with long-term use  Chronic irritable bowel syndrome with mild abdominal cramping intermittently, better with dicyclomine.  Colonoscopy November 2012 showed diverticulosis and rectal polyp that was removed with actually prolapsed mucosa on biopsy results  Abdominal ultrasound 2011 showed fatty liver, CBD 3.6 cm.  HIDA scan with normal EF 95%  History of colon cancer  Denies any nausea, vomiting, abdominal pain, melena or bright red blood per rectum      Outpatient Encounter Medications as of 04/29/2019  Medication Sig  . aliskiren (TEKTURNA) 150 MG tablet Take 150 mg by mouth daily.    Marland Kitchen ALPRAZolam (XANAX) 0.5 MG tablet Take 0.5 mg by mouth at bedtime as needed.    Marland Kitchen aspirin 81 MG tablet Take 81 mg by mouth daily.    . cholecalciferol (VITAMIN D3) 25 MCG (1000 UNIT) tablet Take 1,000 Units by mouth daily.  Marland Kitchen dicyclomine (BENTYL) 20 MG tablet Take 1 tablet (20 mg total) by mouth 2 (two) times daily.  . fexofenadine (ALLEGRA) 180 MG tablet Take 180 mg by mouth as needed. allergies   . furosemide (LASIX) 20 MG tablet Take 20 mg by mouth daily. swelling  . levothyroxine (SYNTHROID, LEVOTHROID) 75 MCG tablet Take 75 mcg by mouth daily.    . pravastatin (PRAVACHOL) 40 MG tablet Take 40 mg by mouth daily.    . Probiotic Product (ALIGN PO) Take 1 tablet by mouth as needed.    . [DISCONTINUED] azelastine (ASTELIN) 0.1 % nasal spray Place 1 spray into both nostrils 2 (two) times daily.  .  [DISCONTINUED] esomeprazole (NEXIUM) 40 MG capsule One daily in the morning 30 minutes before breakfast  . [DISCONTINUED] olopatadine (PATANOL) 0.1 % ophthalmic solution Place 1 drop into both eyes daily.   No facility-administered encounter medications on file as of 04/29/2019.    Allergies as of 04/29/2019 - Review Complete 04/29/2019  Allergen Reaction Noted  . Epinephrine  02/23/2010  . Kenalog  [triamcinolone acetonide]  04/29/2019  . Morphine  09/20/2009  . Propoxyphene hcl  09/20/2009  . Sulfonamide derivatives  09/20/2009    Past Medical History:  Diagnosis Date  . Allergy    seasonal  . Anxiety    occasional  . Arthritis   . Cataract   . Diverticulosis   . DJD (degenerative joint disease)   . Fatty liver disease, nonalcoholic   . Gastritis   . GERD (gastroesophageal reflux disease)   . Hearing impaired   . Heart murmur    MVP  . Hemorrhoids   . Hyperlipidemia   . Hypertension   . Hypothyroidism   . Nephrolithiasis   . Osteopenia   . Thyroid disease     Past Surgical History:  Procedure Laterality Date  . arm surgery Left   . COLONOSCOPY    . DILATION AND CURETTAGE OF UTERUS  1975  . HAND SURGERY Right 1998  . KNEE ARTHROSCOPY Right 1995  . KNEE ARTHROSCOPY Right   . OVARIAN CYST SURGERY  [place]    . TOTAL ABDOMINAL HYSTERECTOMY W/ BILATERAL SALPINGOOPHORECTOMY  1987  . TUBAL LIGATION    . WRIST FRACTURE SURGERY Left 2001    Family History  Problem Relation Age of Onset  . Colon cancer Maternal Grandmother   . Diabetes Father   . Heart disease Father   . Kidney disease Father   . Heart disease Mother   . Heart disease Maternal Grandfather   . Breast cancer Maternal Aunt   . Esophageal cancer Neg Hx   . Stomach cancer Neg Hx     Social History   Socioeconomic History  . Marital status: Married    Spouse name: Not on file  . Number of children: 2  . Years of education: Not on file  . Highest education level: Not on file    Occupational History  . Occupation: Programmer, multimedia: RETIRED    Comment: Retired  Tobacco Use  . Smoking status: Never Smoker  . Smokeless tobacco: Never Used  Substance and Sexual Activity  . Alcohol use: Yes    Alcohol/week: 1.0 standard drinks    Types: 1 Glasses of wine per week  . Drug use: No  . Sexual activity: Not on file  Other Topics Concern  . Not on file  Social History Narrative  . Not on file   Social Determinants of Health   Financial Resource Strain:   . Difficulty of Paying Living Expenses:   Food Insecurity:   . Worried About Charity fundraiser in the Last Year:   . Arboriculturist in the Last Year:   Transportation Needs:   . Film/video editor (Medical):   Marland Kitchen Lack of Transportation (Non-Medical):   Physical Activity:   . Days of Exercise per Week:   . Minutes of Exercise per Session:   Stress:   . Feeling of Stress :   Social Connections:   . Frequency of Communication with Friends and Family:   . Frequency of Social Gatherings with Friends and Family:   . Attends Religious Services:   . Active Member of Clubs or Organizations:   . Attends Archivist Meetings:   Marland Kitchen Marital Status:   Intimate Partner Violence:   . Fear of Current or Ex-Partner:   . Emotionally Abused:   Marland Kitchen Physically Abused:   . Sexually Abused:       Review of systems:  All other review of systems negative except as mentioned in the HPI.   Physical Exam: Vitals:   04/29/19 1031  BP: (!) 150/90  Pulse: 96  Temp: 98.5 F (36.9 C)   Body mass index is 32.61 kg/m. Gen:      No acute distress Abd:      + bowel sounds; soft, non-tender; no palpable masses, no distension Neuro: alert and oriented x 3 Psych: normal mood and affect  Data Reviewed:  Reviewed labs, radiology imaging, old records and pertinent past GI work up   Assessment and Plan/Recommendations:  74 year old female with chronic irritable syndrome, severe diverticulosis here to  reestablish care  Chronic IBS associated with diverticular disease Discussed high-fiber diet and increasing fluid intake Use dicyclomine 20 mg  GERD: Discussed antireflux measures and lifestyle modifications Rx Pepcid 20 mg twice daily as needed Patient is trying to avoid long-term PPI use  Due for colorectal cancer screening November 2022  Return in 1 year or sooner if needed  The patient was provided an opportunity to ask  questions and all were answered. The patient agreed with the plan and demonstrated an understanding of the instructions.  Damaris Hippo , MD    CC: Reynold Bowen, MD

## 2019-04-29 NOTE — Patient Instructions (Signed)
If you are age 76 or older, your body mass index should be between 23-30. Your Body mass index is 32.61 kg/m. If this is out of the aforementioned range listed, please consider follow up with your Primary Care Provider.  If you are age 62 or younger, your body mass index should be between 19-25. Your Body mass index is 32.61 kg/m. If this is out of the aformentioned range listed, please consider follow up with your Primary Care Provider.   We have sent the following medications to your pharmacy for you to pick up at your convenience: [facility] are placed on recall for Endo/Colon November 2022.  Follow up in one year.  Thank you for choosing me and [facility].   Harl Bowie, MD

## 2019-05-05 ENCOUNTER — Encounter: Payer: Self-pay | Admitting: Gastroenterology

## 2019-05-12 ENCOUNTER — Other Ambulatory Visit: Payer: Self-pay

## 2019-05-12 ENCOUNTER — Other Ambulatory Visit: Payer: Self-pay | Admitting: Endocrinology

## 2019-05-12 ENCOUNTER — Ambulatory Visit
Admission: RE | Admit: 2019-05-12 | Discharge: 2019-05-12 | Disposition: A | Discharge status: Home / Self Care | Payer: Medicare Other | Source: Ambulatory Visit | Attending: Endocrinology | Admitting: Endocrinology

## 2019-05-12 DIAGNOSIS — N6489 Other specified disorders of breast: Secondary | ICD-10-CM

## 2019-05-12 DIAGNOSIS — N6011 Diffuse cystic mastopathy of right breast: Secondary | ICD-10-CM | POA: Diagnosis not present

## 2019-05-12 DIAGNOSIS — R928 Other abnormal and inconclusive findings on diagnostic imaging of breast: Secondary | ICD-10-CM | POA: Diagnosis not present

## 2019-05-13 DIAGNOSIS — H40013 Open angle with borderline findings, low risk, bilateral: Secondary | ICD-10-CM | POA: Diagnosis not present

## 2019-05-13 DIAGNOSIS — Z961 Presence of intraocular lens: Secondary | ICD-10-CM | POA: Diagnosis not present

## 2019-05-13 DIAGNOSIS — H18599 Other hereditary corneal dystrophies, unspecified eye: Secondary | ICD-10-CM | POA: Diagnosis not present

## 2019-05-13 DIAGNOSIS — H04123 Dry eye syndrome of bilateral lacrimal glands: Secondary | ICD-10-CM | POA: Diagnosis not present

## 2019-05-14 DIAGNOSIS — J342 Deviated nasal septum: Secondary | ICD-10-CM | POA: Diagnosis not present

## 2019-05-14 DIAGNOSIS — J310 Chronic rhinitis: Secondary | ICD-10-CM | POA: Diagnosis not present

## 2019-05-14 DIAGNOSIS — H903 Sensorineural hearing loss, bilateral: Secondary | ICD-10-CM | POA: Diagnosis not present

## 2019-05-14 DIAGNOSIS — H9313 Tinnitus, bilateral: Secondary | ICD-10-CM | POA: Diagnosis not present

## 2019-05-27 DIAGNOSIS — D126 Benign neoplasm of colon, unspecified: Secondary | ICD-10-CM | POA: Diagnosis not present

## 2019-05-27 DIAGNOSIS — E559 Vitamin D deficiency, unspecified: Secondary | ICD-10-CM | POA: Diagnosis not present

## 2019-05-27 DIAGNOSIS — H9319 Tinnitus, unspecified ear: Secondary | ICD-10-CM | POA: Diagnosis not present

## 2019-05-27 DIAGNOSIS — N6019 Diffuse cystic mastopathy of unspecified breast: Secondary | ICD-10-CM | POA: Diagnosis not present

## 2019-06-19 DIAGNOSIS — M25561 Pain in right knee: Secondary | ICD-10-CM | POA: Diagnosis not present

## 2019-06-19 DIAGNOSIS — M1711 Unilateral primary osteoarthritis, right knee: Secondary | ICD-10-CM | POA: Diagnosis not present

## 2019-06-25 ENCOUNTER — Other Ambulatory Visit: Payer: Self-pay | Admitting: Orthopedic Surgery

## 2019-06-25 DIAGNOSIS — M25561 Pain in right knee: Secondary | ICD-10-CM

## 2019-07-21 DIAGNOSIS — L821 Other seborrheic keratosis: Secondary | ICD-10-CM | POA: Diagnosis not present

## 2019-07-21 DIAGNOSIS — L570 Actinic keratosis: Secondary | ICD-10-CM | POA: Diagnosis not present

## 2019-07-21 DIAGNOSIS — L820 Inflamed seborrheic keratosis: Secondary | ICD-10-CM | POA: Diagnosis not present

## 2019-07-21 DIAGNOSIS — Z85828 Personal history of other malignant neoplasm of skin: Secondary | ICD-10-CM | POA: Diagnosis not present

## 2019-07-24 ENCOUNTER — Ambulatory Visit
Admission: RE | Admit: 2019-07-24 | Discharge: 2019-07-24 | Disposition: A | Discharge status: Home / Self Care | Payer: Medicare Other | Source: Ambulatory Visit | Attending: Orthopedic Surgery | Admitting: Orthopedic Surgery

## 2019-07-24 DIAGNOSIS — M25561 Pain in right knee: Secondary | ICD-10-CM

## 2019-07-24 DIAGNOSIS — S83281A Other tear of lateral meniscus, current injury, right knee, initial encounter: Secondary | ICD-10-CM | POA: Diagnosis not present

## 2019-08-07 DIAGNOSIS — M170 Bilateral primary osteoarthritis of knee: Secondary | ICD-10-CM | POA: Diagnosis not present

## 2019-08-07 DIAGNOSIS — M1712 Unilateral primary osteoarthritis, left knee: Secondary | ICD-10-CM | POA: Diagnosis not present

## 2019-08-07 DIAGNOSIS — M25561 Pain in right knee: Secondary | ICD-10-CM | POA: Diagnosis not present

## 2019-08-07 DIAGNOSIS — M1711 Unilateral primary osteoarthritis, right knee: Secondary | ICD-10-CM | POA: Diagnosis not present

## 2019-09-03 DIAGNOSIS — H40013 Open angle with borderline findings, low risk, bilateral: Secondary | ICD-10-CM | POA: Diagnosis not present

## 2019-09-11 DIAGNOSIS — M25561 Pain in right knee: Secondary | ICD-10-CM | POA: Diagnosis not present

## 2019-09-11 DIAGNOSIS — M1711 Unilateral primary osteoarthritis, right knee: Secondary | ICD-10-CM | POA: Diagnosis not present

## 2019-09-18 DIAGNOSIS — M25561 Pain in right knee: Secondary | ICD-10-CM | POA: Diagnosis not present

## 2019-09-18 DIAGNOSIS — M1711 Unilateral primary osteoarthritis, right knee: Secondary | ICD-10-CM | POA: Diagnosis not present

## 2019-09-25 ENCOUNTER — Other Ambulatory Visit: Payer: Self-pay | Admitting: Endocrinology

## 2019-09-25 DIAGNOSIS — M1711 Unilateral primary osteoarthritis, right knee: Secondary | ICD-10-CM | POA: Diagnosis not present

## 2019-09-25 DIAGNOSIS — M25561 Pain in right knee: Secondary | ICD-10-CM | POA: Diagnosis not present

## 2019-09-25 DIAGNOSIS — Z1231 Encounter for screening mammogram for malignant neoplasm of breast: Secondary | ICD-10-CM

## 2019-10-09 DIAGNOSIS — Z0120 Encounter for dental examination and cleaning without abnormal findings: Secondary | ICD-10-CM | POA: Diagnosis not present

## 2019-11-04 ENCOUNTER — Ambulatory Visit
Admission: RE | Admit: 2019-11-04 | Discharge: 2019-11-04 | Disposition: A | Discharge status: Home / Self Care | Payer: Medicare Other | Source: Ambulatory Visit | Attending: Endocrinology | Admitting: Endocrinology

## 2019-11-04 ENCOUNTER — Other Ambulatory Visit: Payer: Self-pay

## 2019-11-04 DIAGNOSIS — Z1231 Encounter for screening mammogram for malignant neoplasm of breast: Secondary | ICD-10-CM | POA: Diagnosis not present

## 2019-11-05 DIAGNOSIS — M25561 Pain in right knee: Secondary | ICD-10-CM | POA: Diagnosis not present

## 2019-11-05 DIAGNOSIS — M1711 Unilateral primary osteoarthritis, right knee: Secondary | ICD-10-CM | POA: Diagnosis not present

## 2019-12-02 DIAGNOSIS — E785 Hyperlipidemia, unspecified: Secondary | ICD-10-CM | POA: Diagnosis not present

## 2019-12-02 DIAGNOSIS — R7301 Impaired fasting glucose: Secondary | ICD-10-CM | POA: Diagnosis not present

## 2019-12-02 DIAGNOSIS — E559 Vitamin D deficiency, unspecified: Secondary | ICD-10-CM | POA: Diagnosis not present

## 2019-12-02 DIAGNOSIS — E039 Hypothyroidism, unspecified: Secondary | ICD-10-CM | POA: Diagnosis not present

## 2019-12-09 DIAGNOSIS — R82998 Other abnormal findings in urine: Secondary | ICD-10-CM | POA: Diagnosis not present

## 2019-12-09 DIAGNOSIS — I10 Essential (primary) hypertension: Secondary | ICD-10-CM | POA: Diagnosis not present

## 2020-03-04 DIAGNOSIS — H40013 Open angle with borderline findings, low risk, bilateral: Secondary | ICD-10-CM | POA: Diagnosis not present

## 2020-03-28 DIAGNOSIS — H40013 Open angle with borderline findings, low risk, bilateral: Secondary | ICD-10-CM | POA: Diagnosis not present

## 2020-04-01 IMAGING — MG DIGITAL SCREENING BILATERAL MAMMOGRAM WITH TOMO AND CAD
8 series · 8 of 24 positions shown · non-contrast
Comparison: Previous exam(s).

CLINICAL DATA: Screening.

EXAM:
DIGITAL SCREENING BILATERAL MAMMOGRAM WITH TOMO AND CAD

[L CC synth-2D]
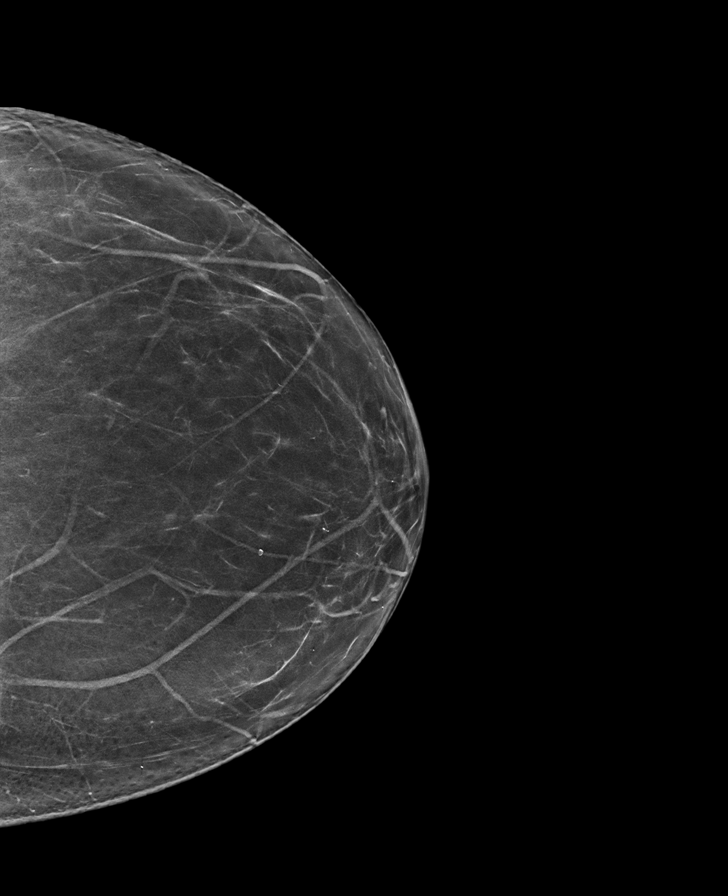

[R CC synth-2D]
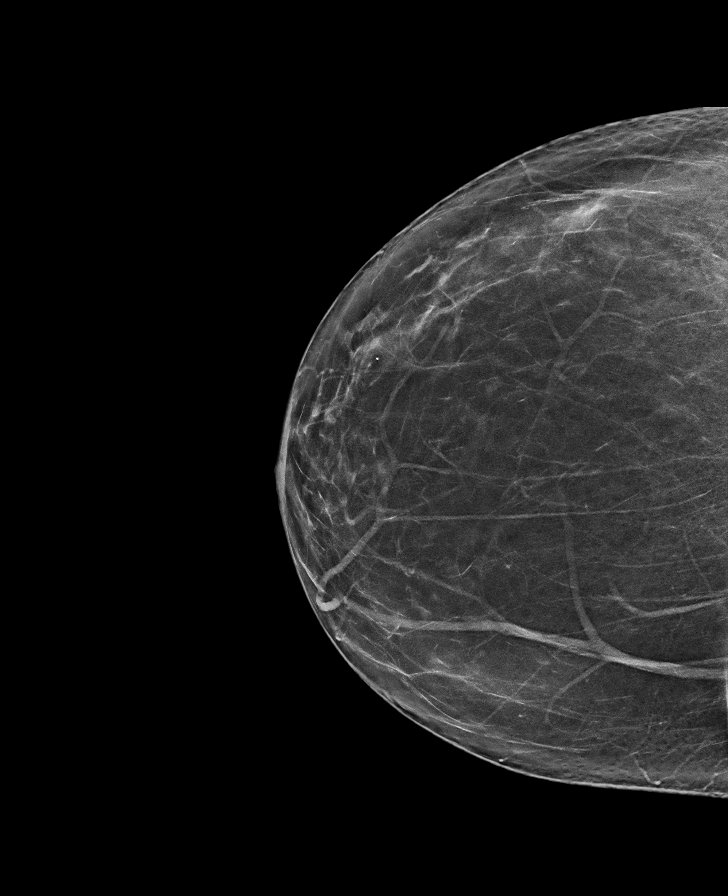

[L MLO synth-2D]
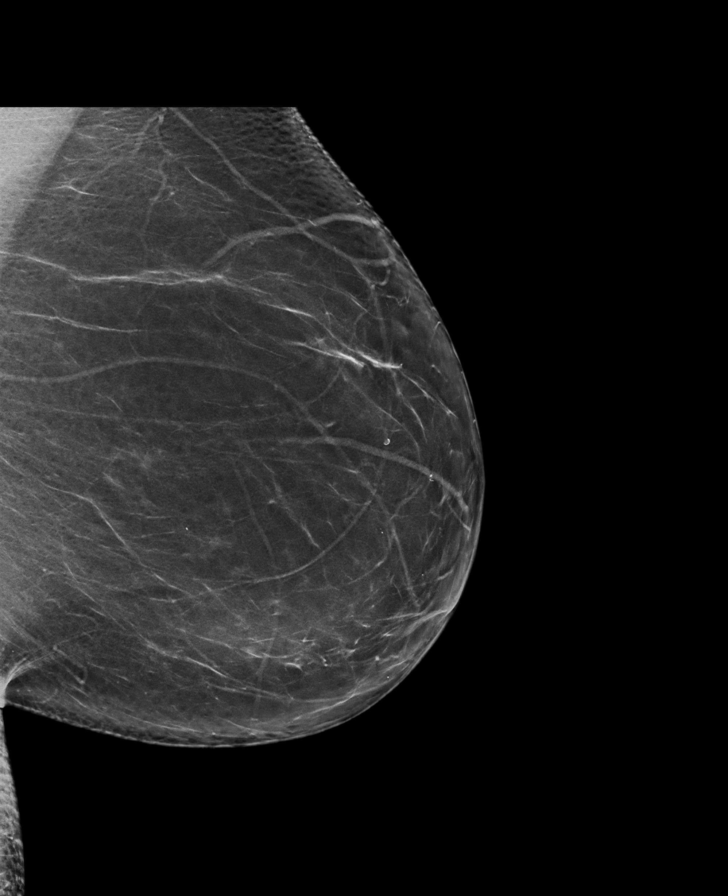

[R MLO synth-2D]
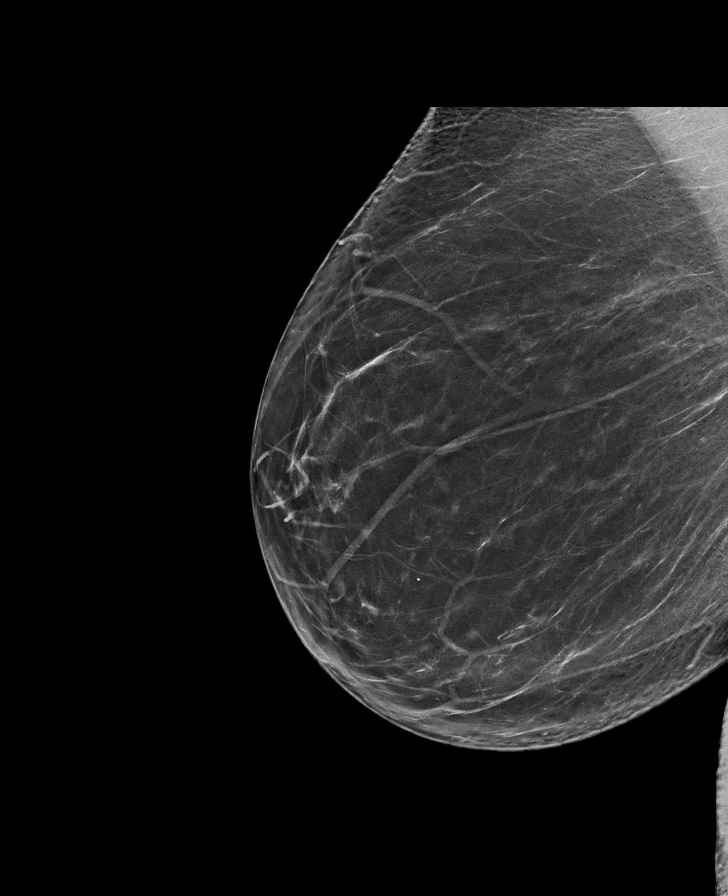

[L MLO tomo · tomo slice 43/86.0]
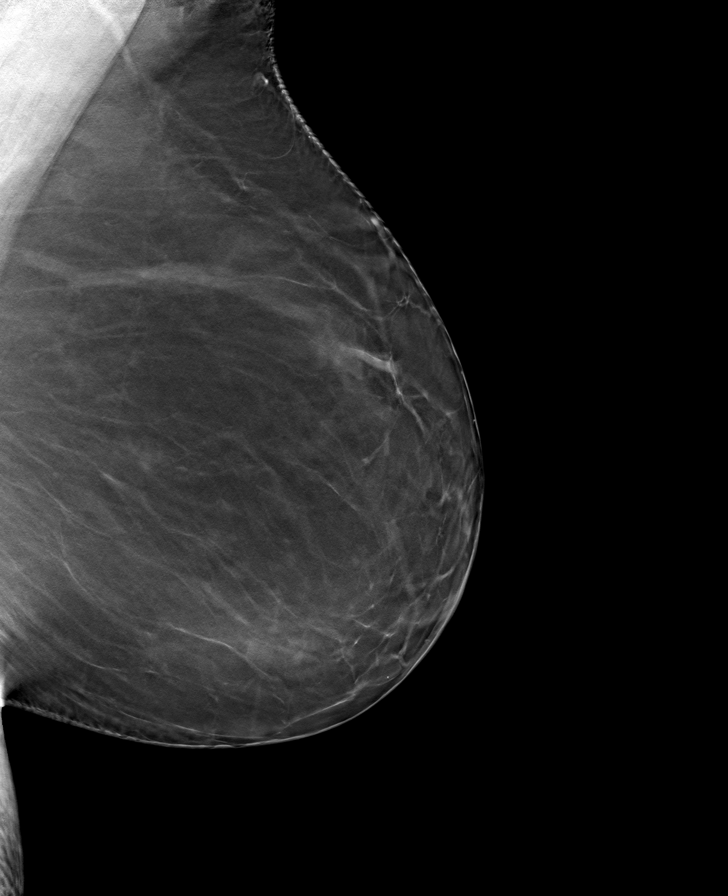

[L CC tomo · tomo slice 39/77.0]
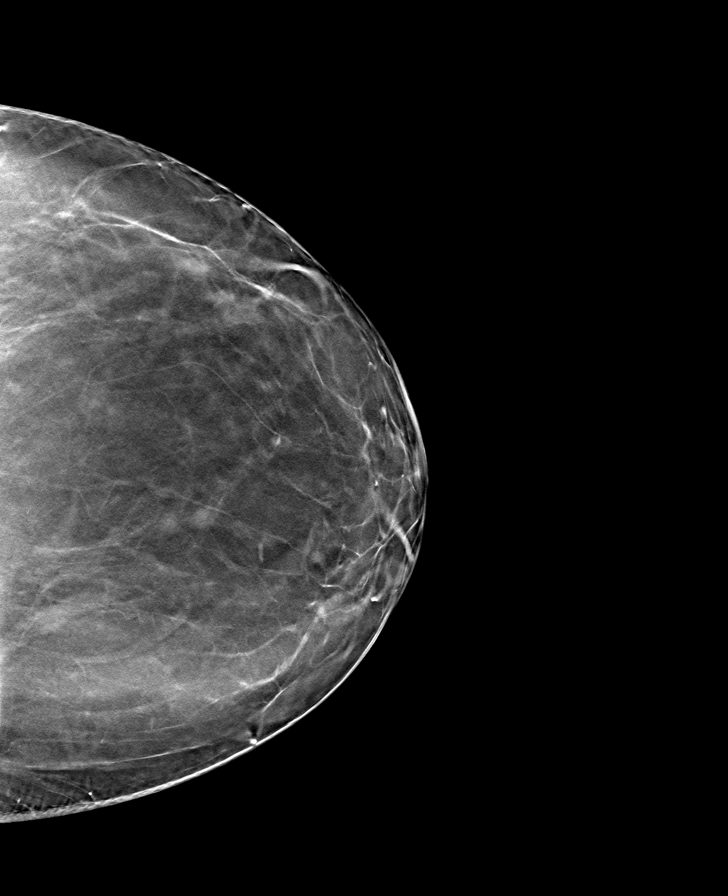

[R CC tomo · tomo slice 38/75.0]
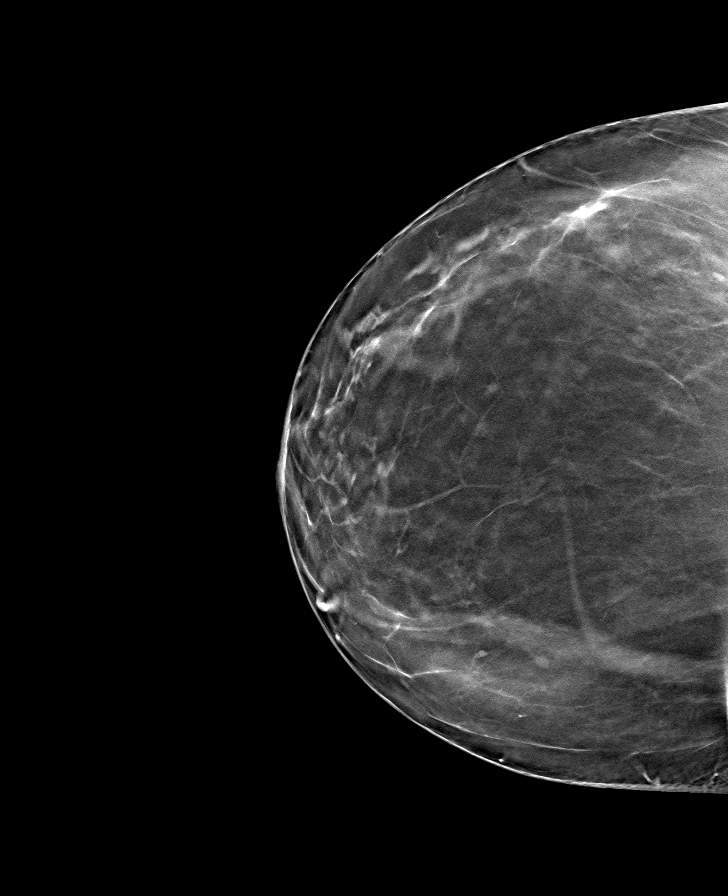

[R MLO tomo · tomo slice 43/84.0]
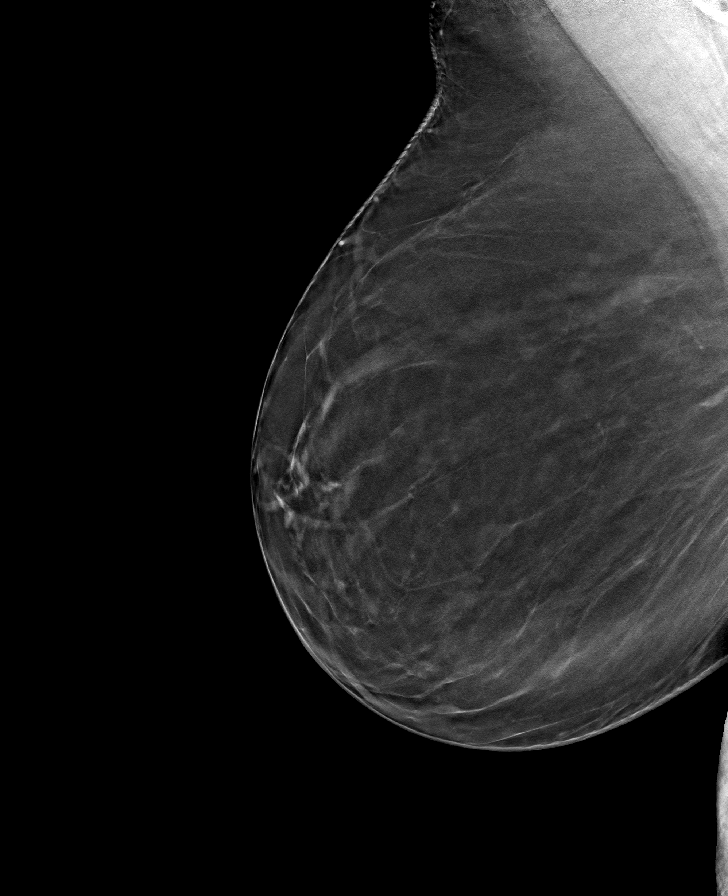

[8 of 24 positions shown; findings below may reference images not displayed]

ACR Breast Density Category b: There are scattered areas of
fibroglandular density.
FINDINGS: There are no findings suspicious for malignancy. Images were
processed with CAD.
IMPRESSION: No mammographic evidence of malignancy. A result letter of this
screening mammogram will be mailed directly to the patient.

RECOMMENDATION:
Screening mammogram in one year. (Code:[id number])

BI-RADS CATEGORY  1: Negative.

## 2020-05-10 DIAGNOSIS — M170 Bilateral primary osteoarthritis of knee: Secondary | ICD-10-CM | POA: Diagnosis not present

## 2020-05-10 DIAGNOSIS — M1711 Unilateral primary osteoarthritis, right knee: Secondary | ICD-10-CM | POA: Diagnosis not present

## 2020-05-10 DIAGNOSIS — M1712 Unilateral primary osteoarthritis, left knee: Secondary | ICD-10-CM | POA: Diagnosis not present

## 2020-05-17 DIAGNOSIS — M170 Bilateral primary osteoarthritis of knee: Secondary | ICD-10-CM | POA: Diagnosis not present

## 2020-05-17 DIAGNOSIS — M1712 Unilateral primary osteoarthritis, left knee: Secondary | ICD-10-CM | POA: Diagnosis not present

## 2020-05-17 DIAGNOSIS — M1711 Unilateral primary osteoarthritis, right knee: Secondary | ICD-10-CM | POA: Diagnosis not present

## 2020-05-18 DIAGNOSIS — H04123 Dry eye syndrome of bilateral lacrimal glands: Secondary | ICD-10-CM | POA: Diagnosis not present

## 2020-05-18 DIAGNOSIS — Z961 Presence of intraocular lens: Secondary | ICD-10-CM | POA: Diagnosis not present

## 2020-05-18 DIAGNOSIS — H40013 Open angle with borderline findings, low risk, bilateral: Secondary | ICD-10-CM | POA: Diagnosis not present

## 2020-05-24 DIAGNOSIS — M1711 Unilateral primary osteoarthritis, right knee: Secondary | ICD-10-CM | POA: Diagnosis not present

## 2020-06-14 DIAGNOSIS — R7301 Impaired fasting glucose: Secondary | ICD-10-CM | POA: Diagnosis not present

## 2020-06-14 DIAGNOSIS — E039 Hypothyroidism, unspecified: Secondary | ICD-10-CM | POA: Diagnosis not present

## 2020-06-14 DIAGNOSIS — K760 Fatty (change of) liver, not elsewhere classified: Secondary | ICD-10-CM | POA: Diagnosis not present

## 2020-06-14 DIAGNOSIS — E559 Vitamin D deficiency, unspecified: Secondary | ICD-10-CM | POA: Diagnosis not present

## 2020-06-23 DIAGNOSIS — M109 Gout, unspecified: Secondary | ICD-10-CM | POA: Diagnosis not present

## 2020-06-23 DIAGNOSIS — I10 Essential (primary) hypertension: Secondary | ICD-10-CM | POA: Diagnosis not present

## 2020-06-23 DIAGNOSIS — L237 Allergic contact dermatitis due to plants, except food: Secondary | ICD-10-CM | POA: Diagnosis not present

## 2020-07-25 DIAGNOSIS — L821 Other seborrheic keratosis: Secondary | ICD-10-CM | POA: Diagnosis not present

## 2020-07-25 DIAGNOSIS — L820 Inflamed seborrheic keratosis: Secondary | ICD-10-CM | POA: Diagnosis not present

## 2020-07-25 DIAGNOSIS — L814 Other melanin hyperpigmentation: Secondary | ICD-10-CM | POA: Diagnosis not present

## 2020-07-25 DIAGNOSIS — Z85828 Personal history of other malignant neoplasm of skin: Secondary | ICD-10-CM | POA: Diagnosis not present

## 2020-08-18 DIAGNOSIS — H40013 Open angle with borderline findings, low risk, bilateral: Secondary | ICD-10-CM | POA: Diagnosis not present

## 2020-08-22 ENCOUNTER — Other Ambulatory Visit: Payer: Self-pay | Admitting: Gastroenterology

## 2020-09-07 DIAGNOSIS — J0100 Acute maxillary sinusitis, unspecified: Secondary | ICD-10-CM | POA: Diagnosis not present

## 2020-09-07 DIAGNOSIS — R059 Cough, unspecified: Secondary | ICD-10-CM | POA: Diagnosis not present

## 2020-09-07 DIAGNOSIS — U071 COVID-19: Secondary | ICD-10-CM | POA: Diagnosis not present

## 2020-09-07 DIAGNOSIS — R5383 Other fatigue: Secondary | ICD-10-CM | POA: Diagnosis not present

## 2020-09-26 ENCOUNTER — Other Ambulatory Visit: Payer: Self-pay | Admitting: Endocrinology

## 2020-09-26 DIAGNOSIS — Z1231 Encounter for screening mammogram for malignant neoplasm of breast: Secondary | ICD-10-CM

## 2020-09-26 DIAGNOSIS — H40013 Open angle with borderline findings, low risk, bilateral: Secondary | ICD-10-CM | POA: Diagnosis not present

## 2020-10-03 DIAGNOSIS — D485 Neoplasm of uncertain behavior of skin: Secondary | ICD-10-CM | POA: Diagnosis not present

## 2020-10-03 DIAGNOSIS — Z85828 Personal history of other malignant neoplasm of skin: Secondary | ICD-10-CM | POA: Diagnosis not present

## 2020-10-03 DIAGNOSIS — L820 Inflamed seborrheic keratosis: Secondary | ICD-10-CM | POA: Diagnosis not present

## 2020-10-14 ENCOUNTER — Other Ambulatory Visit: Payer: Self-pay | Admitting: Gastroenterology

## 2020-11-11 ENCOUNTER — Ambulatory Visit
Admission: RE | Admit: 2020-11-11 | Discharge: 2020-11-11 | Disposition: A | Discharge status: Home / Self Care | Payer: Medicare Other | Source: Ambulatory Visit | Attending: Endocrinology | Admitting: Endocrinology

## 2020-11-11 ENCOUNTER — Other Ambulatory Visit: Payer: Self-pay

## 2020-11-11 DIAGNOSIS — Z1231 Encounter for screening mammogram for malignant neoplasm of breast: Secondary | ICD-10-CM | POA: Diagnosis not present

## 2020-11-24 DIAGNOSIS — M1711 Unilateral primary osteoarthritis, right knee: Secondary | ICD-10-CM | POA: Diagnosis not present

## 2020-12-01 DIAGNOSIS — M1711 Unilateral primary osteoarthritis, right knee: Secondary | ICD-10-CM | POA: Diagnosis not present

## 2020-12-06 DIAGNOSIS — M1711 Unilateral primary osteoarthritis, right knee: Secondary | ICD-10-CM | POA: Diagnosis not present

## 2020-12-06 DIAGNOSIS — M170 Bilateral primary osteoarthritis of knee: Secondary | ICD-10-CM | POA: Diagnosis not present

## 2020-12-06 DIAGNOSIS — M1712 Unilateral primary osteoarthritis, left knee: Secondary | ICD-10-CM | POA: Diagnosis not present

## 2020-12-12 DIAGNOSIS — R7301 Impaired fasting glucose: Secondary | ICD-10-CM | POA: Diagnosis not present

## 2020-12-12 DIAGNOSIS — E785 Hyperlipidemia, unspecified: Secondary | ICD-10-CM | POA: Diagnosis not present

## 2020-12-12 DIAGNOSIS — E559 Vitamin D deficiency, unspecified: Secondary | ICD-10-CM | POA: Diagnosis not present

## 2020-12-12 DIAGNOSIS — E039 Hypothyroidism, unspecified: Secondary | ICD-10-CM | POA: Diagnosis not present

## 2020-12-23 DIAGNOSIS — R82998 Other abnormal findings in urine: Secondary | ICD-10-CM | POA: Diagnosis not present

## 2020-12-23 DIAGNOSIS — Z1212 Encounter for screening for malignant neoplasm of rectum: Secondary | ICD-10-CM | POA: Diagnosis not present

## 2020-12-29 DIAGNOSIS — Z85828 Personal history of other malignant neoplasm of skin: Secondary | ICD-10-CM | POA: Diagnosis not present

## 2020-12-29 DIAGNOSIS — M7130 Other bursal cyst, unspecified site: Secondary | ICD-10-CM | POA: Diagnosis not present

## 2021-01-11 DIAGNOSIS — R2232 Localized swelling, mass and lump, left upper limb: Secondary | ICD-10-CM | POA: Diagnosis not present

## 2021-01-20 DIAGNOSIS — M170 Bilateral primary osteoarthritis of knee: Secondary | ICD-10-CM | POA: Diagnosis not present

## 2021-02-17 DIAGNOSIS — H40013 Open angle with borderline findings, low risk, bilateral: Secondary | ICD-10-CM | POA: Diagnosis not present

## 2021-02-20 DIAGNOSIS — H40013 Open angle with borderline findings, low risk, bilateral: Secondary | ICD-10-CM | POA: Diagnosis not present

## 2021-04-06 DIAGNOSIS — M25512 Pain in left shoulder: Secondary | ICD-10-CM | POA: Diagnosis not present

## 2021-04-06 DIAGNOSIS — M7542 Impingement syndrome of left shoulder: Secondary | ICD-10-CM | POA: Diagnosis not present

## 2021-04-18 ENCOUNTER — Encounter: Payer: Self-pay | Admitting: Gastroenterology

## 2021-04-18 DIAGNOSIS — M7542 Impingement syndrome of left shoulder: Secondary | ICD-10-CM | POA: Diagnosis not present

## 2021-04-27 DIAGNOSIS — M1712 Unilateral primary osteoarthritis, left knee: Secondary | ICD-10-CM | POA: Diagnosis not present

## 2021-04-27 DIAGNOSIS — M7542 Impingement syndrome of left shoulder: Secondary | ICD-10-CM | POA: Diagnosis not present

## 2021-04-27 DIAGNOSIS — M13862 Other specified arthritis, left knee: Secondary | ICD-10-CM | POA: Diagnosis not present

## 2021-05-04 DIAGNOSIS — M1711 Unilateral primary osteoarthritis, right knee: Secondary | ICD-10-CM | POA: Diagnosis not present

## 2021-05-08 DIAGNOSIS — M7542 Impingement syndrome of left shoulder: Secondary | ICD-10-CM | POA: Diagnosis not present

## 2021-05-11 DIAGNOSIS — M1712 Unilateral primary osteoarthritis, left knee: Secondary | ICD-10-CM | POA: Diagnosis not present

## 2021-05-16 DIAGNOSIS — M7542 Impingement syndrome of left shoulder: Secondary | ICD-10-CM | POA: Diagnosis not present

## 2021-05-22 DIAGNOSIS — M7542 Impingement syndrome of left shoulder: Secondary | ICD-10-CM | POA: Diagnosis not present

## 2021-05-23 DIAGNOSIS — M7542 Impingement syndrome of left shoulder: Secondary | ICD-10-CM | POA: Diagnosis not present

## 2021-05-30 DIAGNOSIS — M7542 Impingement syndrome of left shoulder: Secondary | ICD-10-CM | POA: Diagnosis not present

## 2021-06-06 DIAGNOSIS — M7542 Impingement syndrome of left shoulder: Secondary | ICD-10-CM | POA: Diagnosis not present

## 2021-07-07 DIAGNOSIS — K760 Fatty (change of) liver, not elsewhere classified: Secondary | ICD-10-CM | POA: Diagnosis not present

## 2021-07-07 DIAGNOSIS — R7301 Impaired fasting glucose: Secondary | ICD-10-CM | POA: Diagnosis not present

## 2021-07-07 DIAGNOSIS — E039 Hypothyroidism, unspecified: Secondary | ICD-10-CM | POA: Diagnosis not present

## 2021-07-07 DIAGNOSIS — E559 Vitamin D deficiency, unspecified: Secondary | ICD-10-CM | POA: Diagnosis not present

## 2021-07-24 DIAGNOSIS — L570 Actinic keratosis: Secondary | ICD-10-CM | POA: Diagnosis not present

## 2021-07-24 DIAGNOSIS — Z85828 Personal history of other malignant neoplasm of skin: Secondary | ICD-10-CM | POA: Diagnosis not present

## 2021-07-24 DIAGNOSIS — L821 Other seborrheic keratosis: Secondary | ICD-10-CM | POA: Diagnosis not present

## 2021-08-24 DIAGNOSIS — M1711 Unilateral primary osteoarthritis, right knee: Secondary | ICD-10-CM | POA: Diagnosis not present

## 2021-08-29 DIAGNOSIS — L905 Scar conditions and fibrosis of skin: Secondary | ICD-10-CM | POA: Diagnosis not present

## 2021-08-29 DIAGNOSIS — Z85828 Personal history of other malignant neoplasm of skin: Secondary | ICD-10-CM | POA: Diagnosis not present

## 2021-08-29 DIAGNOSIS — L570 Actinic keratosis: Secondary | ICD-10-CM | POA: Diagnosis not present

## 2021-08-29 DIAGNOSIS — B078 Other viral warts: Secondary | ICD-10-CM | POA: Diagnosis not present

## 2021-08-31 DIAGNOSIS — M1711 Unilateral primary osteoarthritis, right knee: Secondary | ICD-10-CM | POA: Diagnosis not present

## 2021-09-05 DIAGNOSIS — M1711 Unilateral primary osteoarthritis, right knee: Secondary | ICD-10-CM | POA: Diagnosis not present

## 2021-09-18 DIAGNOSIS — H9201 Otalgia, right ear: Secondary | ICD-10-CM | POA: Diagnosis not present

## 2021-09-18 DIAGNOSIS — H6991 Unspecified Eustachian tube disorder, right ear: Secondary | ICD-10-CM | POA: Diagnosis not present

## 2021-09-18 DIAGNOSIS — J029 Acute pharyngitis, unspecified: Secondary | ICD-10-CM | POA: Diagnosis not present

## 2021-09-18 DIAGNOSIS — I10 Essential (primary) hypertension: Secondary | ICD-10-CM | POA: Diagnosis not present

## 2021-10-04 ENCOUNTER — Other Ambulatory Visit: Payer: Self-pay | Admitting: Endocrinology

## 2021-10-04 DIAGNOSIS — Z1231 Encounter for screening mammogram for malignant neoplasm of breast: Secondary | ICD-10-CM

## 2021-11-16 ENCOUNTER — Ambulatory Visit: Payer: Medicare Other

## 2021-11-16 DIAGNOSIS — M1712 Unilateral primary osteoarthritis, left knee: Secondary | ICD-10-CM | POA: Diagnosis not present

## 2021-11-20 ENCOUNTER — Ambulatory Visit
Admission: RE | Admit: 2021-11-20 | Discharge: 2021-11-20 | Disposition: A | Discharge status: Home / Self Care | Payer: Medicare Other | Source: Ambulatory Visit | Attending: Endocrinology | Admitting: Endocrinology

## 2021-11-20 DIAGNOSIS — Z1231 Encounter for screening mammogram for malignant neoplasm of breast: Secondary | ICD-10-CM | POA: Diagnosis not present

## 2021-11-23 DIAGNOSIS — M1712 Unilateral primary osteoarthritis, left knee: Secondary | ICD-10-CM | POA: Diagnosis not present

## 2021-11-30 DIAGNOSIS — M1712 Unilateral primary osteoarthritis, left knee: Secondary | ICD-10-CM | POA: Diagnosis not present

## 2021-12-04 DIAGNOSIS — M25521 Pain in right elbow: Secondary | ICD-10-CM | POA: Diagnosis not present

## 2021-12-04 DIAGNOSIS — G5621 Lesion of ulnar nerve, right upper limb: Secondary | ICD-10-CM | POA: Diagnosis not present

## 2021-12-06 DIAGNOSIS — H40013 Open angle with borderline findings, low risk, bilateral: Secondary | ICD-10-CM | POA: Diagnosis not present

## 2021-12-26 DIAGNOSIS — Z1211 Encounter for screening for malignant neoplasm of colon: Secondary | ICD-10-CM | POA: Diagnosis not present

## 2021-12-26 DIAGNOSIS — E559 Vitamin D deficiency, unspecified: Secondary | ICD-10-CM | POA: Diagnosis not present

## 2021-12-26 DIAGNOSIS — E785 Hyperlipidemia, unspecified: Secondary | ICD-10-CM | POA: Diagnosis not present

## 2021-12-26 DIAGNOSIS — I10 Essential (primary) hypertension: Secondary | ICD-10-CM | POA: Diagnosis not present

## 2021-12-26 DIAGNOSIS — E039 Hypothyroidism, unspecified: Secondary | ICD-10-CM | POA: Diagnosis not present

## 2022-01-01 DIAGNOSIS — Z85828 Personal history of other malignant neoplasm of skin: Secondary | ICD-10-CM | POA: Diagnosis not present

## 2022-01-01 DIAGNOSIS — D485 Neoplasm of uncertain behavior of skin: Secondary | ICD-10-CM | POA: Diagnosis not present

## 2022-01-01 DIAGNOSIS — D0472 Carcinoma in situ of skin of left lower limb, including hip: Secondary | ICD-10-CM | POA: Diagnosis not present

## 2022-01-02 DIAGNOSIS — I10 Essential (primary) hypertension: Secondary | ICD-10-CM | POA: Diagnosis not present

## 2022-01-02 DIAGNOSIS — E039 Hypothyroidism, unspecified: Secondary | ICD-10-CM | POA: Diagnosis not present

## 2022-01-02 DIAGNOSIS — R82998 Other abnormal findings in urine: Secondary | ICD-10-CM | POA: Diagnosis not present

## 2022-01-02 DIAGNOSIS — Z0000 Encounter for general adult medical examination without abnormal findings: Secondary | ICD-10-CM | POA: Diagnosis not present

## 2022-01-02 DIAGNOSIS — R7301 Impaired fasting glucose: Secondary | ICD-10-CM | POA: Diagnosis not present

## 2022-01-05 DIAGNOSIS — R5383 Other fatigue: Secondary | ICD-10-CM | POA: Diagnosis not present

## 2022-01-05 DIAGNOSIS — R5081 Fever presenting with conditions classified elsewhere: Secondary | ICD-10-CM | POA: Diagnosis not present

## 2022-01-05 DIAGNOSIS — I10 Essential (primary) hypertension: Secondary | ICD-10-CM | POA: Diagnosis not present

## 2022-01-05 DIAGNOSIS — J101 Influenza due to other identified influenza virus with other respiratory manifestations: Secondary | ICD-10-CM | POA: Diagnosis not present

## 2022-02-13 DIAGNOSIS — K080 Exfoliation of teeth due to systemic causes: Secondary | ICD-10-CM | POA: Diagnosis not present

## 2022-02-26 ENCOUNTER — Other Ambulatory Visit: Payer: Self-pay | Admitting: Gastroenterology

## 2022-02-27 ENCOUNTER — Telehealth: Payer: Self-pay | Admitting: Gastroenterology

## 2022-02-27 NOTE — Telephone Encounter (Signed)
Dr Silverio Decamp, Can I refill Bentyl for patient? She has not been seen since 2021

## 2022-02-27 NOTE — Telephone Encounter (Signed)
Inbound call from patient stating that she is having issues with getting a refill for Bentyl and is requesting a call back to discuss. Please advise.

## 2022-02-28 MED ORDER — DICYCLOMINE HCL 10 MG PO CAPS: 10.0000 mg | ORAL_CAPSULE | Freq: Two times a day (BID) | ORAL | 0 refills | Status: DC

## 2022-02-28 NOTE — Telephone Encounter (Signed)
Please send prescription for Bentyl 10 mg twice daily as needed 30-day supply with no refills.  Please schedule next available appointment with me or APP.  Thank you

## 2022-02-28 NOTE — Telephone Encounter (Signed)
Scheduled pt for 2/9 with Tye Savoy, Did not see that she had seen any other PA's in the past.

## 2022-02-28 NOTE — Telephone Encounter (Signed)
Dicyclomine refilled today, 10 mg to take twice a day with no refills  Will schedule the patient a follow up appointment

## 2022-03-15 DIAGNOSIS — M1711 Unilateral primary osteoarthritis, right knee: Secondary | ICD-10-CM | POA: Diagnosis not present

## 2022-03-22 DIAGNOSIS — M1711 Unilateral primary osteoarthritis, right knee: Secondary | ICD-10-CM | POA: Diagnosis not present

## 2022-03-23 ENCOUNTER — Ambulatory Visit: Payer: Medicare Other | Admitting: Nurse Practitioner

## 2022-03-29 DIAGNOSIS — M1711 Unilateral primary osteoarthritis, right knee: Secondary | ICD-10-CM | POA: Diagnosis not present

## 2022-04-12 ENCOUNTER — Other Ambulatory Visit: Payer: Self-pay | Admitting: Gastroenterology

## 2022-04-17 NOTE — Progress Notes (Unsigned)
04/18/2022 CLARITA SEACAT [id number] Jan 24, 1946  Referring provider: Reynold Bowen, MD Primary GI doctor: Dr. Silverio Decamp ( Dr. Olevia Perches)  ASSESSMENT AND PLAN:   Irritable bowel syndrome with both constipation and diarrhea Take dicyclomine 1-2 x a week, refilled.  FODMAP, trial off lactulose and lifestyle changes discussed  Gastroesophageal reflux disease without esophagitis Lifestyle changes discussed, avoid NSAIDS, ETOH Will add on pepcid at night for a month, and then can do as needed Elevate head bed Weight loss discussed with patient in detail.  Fatty liver via AB [place] 2011 --Continue to work on risk factor modification including diet exercise and control of risk factors including blood sugars. - monitor q 6 months.   Screen for colon cancer Had normal colonoscopy 2012 Negative cologuard 5 years ago Yearly FOBT negative with Dr. Forde Dandy, PCP.  We had a long discussion about the purpose of colon cancer screening and the different methods, including FIT test, Cologuard, virtual colonoscopy, and colonoscopy, including the benefits and their inherent limitations.  Declines any colon cancer screening at this time.  If the patient has any concerning symptoms in the interim, she will contact our office.    Patient Care Team: Reynold Bowen, MD as PCP - General (Endocrinology)  HISTORY OF PRESENT ILLNESS: 77 y.o. female with a past medical history of chronic GERD, colonic diverticulosis. Irritable bowel syndrome and others listed below presents for evaluation of GERD, fatty liver, IBS.   Colonoscopy November 2012 showed diverticulosis and rectal polyp that was removed with actually prolapsed mucosa on biopsy results due for recall colonoscopy 12/2020 Abdominal ultrasound 2011 showed fatty liver, CBD 3.6 cm.   HIDA scan with normal EF 95%   04/29/2019 last office visit with Dr. Silverio Decamp for abdominal discomfort, given dicyclomine 20 mg daily, Pepcid 20 mg twice daily as needed to  avoid long-term PPI use.    She states the pepcid she takes as needed and the dicyclomine is as needed.   Mainly takes pepcid before bed as needed, has some reflux at night.  No dysphagia, no melena.  She does state she took 4-5 of her medications at one time and  No NSAIDS, she has glass of wine about 2 a week.  She has BM once or twice a day.  No blood in stool, she has FOBT yearly from Dr. Forde Dandy and it is negative.  Had negative cologuard negative about 5 years.  Follows with them every 6 months.  No anemia.   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 She has never used smokeless tobacco. She reports current alcohol use of about 1.0 standard drink of alcohol per week. She reports that she does not use drugs.      Current Medications:   Current Outpatient Medications (Endocrine & Metabolic):    levothyroxine (SYNTHROID) 88 MCG tablet, Take 88 mcg by mouth daily before breakfast.  Current Outpatient Medications (Cardiovascular):    aliskiren (TEKTURNA) 150 MG tablet, Take 150 mg by mouth daily.     ezetimibe (ZETIA) 10 MG tablet, Take 10 mg by mouth daily.   furosemide (LASIX) 20 MG tablet, Take 20 mg by mouth daily. swelling   pravastatin (PRAVACHOL) 40 MG tablet, Take 40 mg by mouth daily.     Current Outpatient Medications (Analgesics):    acetaminophen (TYLENOL 8 HOUR ARTHRITIS PAIN) 650 MG CR tablet, Take 650 mg by mouth as needed.   aspirin 81 MG tablet, Take 81 mg  by mouth daily.     colchicine 0.6 MG tablet, Take 0.6 mg by mouth as needed.   Current Outpatient Medications (Other):    ALPRAZolam (XANAX) 0.5 MG tablet, Take 0.5 mg by mouth at bedtime as needed.     carboxymethylcellulose (REFRESH PLUS) 0.5 % SOLN, Place 1 drop into both eyes daily as needed.   Cholecalciferol (REPLESTA) 1.25 MG (50000 UT) WAFR, Take 1 Wafer by mouth once a week.   diclofenac Sodium (VOLTAREN) 1 % GEL, Apply 2 g topically as  needed.   dicyclomine (BENTYL) 10 MG capsule, Take 1 capsule (10 mg total) by mouth in the morning and at bedtime.   methocarbamol (ROBAXIN) 500 MG tablet, Take 500 mg by mouth as needed.   prednisoLONE acetate (PRED FORTE) 1 % ophthalmic suspension, Place 1 drop into the right eye 2 (two) times daily.   Probiotic Product (ALIGN PO), Take 1 tablet by mouth as needed.     sodium chloride (MURO 128) 5 % ophthalmic ointment, Place 1 Application into both eyes at bedtime.   famotidine (PEPCID) 20 MG tablet, TAKE ONE TABLET BY MOUTH TWICE A DAY AS NEEDED FOR HEARTBURN OR INDIGESTION  Medical History:  Past Medical History:  Diagnosis Date   Allergy    seasonal   Anxiety    occasional   Arthritis    Cataract    Diverticulosis    DJD (degenerative joint disease)    Fatty liver disease, nonalcoholic    Gastritis    GERD (gastroesophageal reflux disease)    Gout    Hearing impaired    Heart murmur    MVP   Hemorrhoids    Hyperlipidemia    Hypertension    Hypothyroidism    Nephrolithiasis    Osteopenia    Thyroid disease    Allergies:  Allergies  Allergen Reactions   Epinephrine     REACTION: LARGE AMOUNT/heart races   Kenalog  [Triamcinolone Acetonide]    Morphine     REACTION: rash/itching   Propoxyphene Hcl     REACTION: nausea   Sulfonamide Derivatives     REACTION: Rash/itching     Surgical History:  She  has a past surgical history that includes Dilation and curettage of uterus (1975); Ovarian cyst surgery (1968); Hand surgery (Right, 1998); Total abdominal hysterectomy w/ bilateral salpingoophorectomy (1987); Knee arthroscopy (Right, 1995); Wrist fracture surgery (Left, 2001); Colonoscopy; arm surgery (Left); Tubal ligation; Knee arthroscopy (Right); and Tonsillectomy. Family History:  Her family history includes Breast cancer in her maternal aunt; Colon cancer in her maternal grandmother; Diabetes in her father; Heart disease in her father, maternal grandfather, and  mother; Kidney disease in her father.  REVIEW OF SYSTEMS  : All other systems reviewed and negative except where noted in the History of Present Illness.  PHYSICAL EXAM: BP (!) 150/74 (BP Location: Right Arm, Patient Position: Sitting, Cuff Size: Large)   Pulse 76   Ht '5\' 4"'$  (1.626 m)   Wt 186 lb 2 oz (84.4 kg)   BMI 31.95 kg/m  General Appearance: Well nourished, in no apparent distress. Head:   Normocephalic and atraumatic. Eyes:  sclerae anicteric,conjunctive pink  Respiratory: Respiratory effort normal, BS equal bilaterally without rales, rhonchi, wheezing. Cardio: RRR with no MRGs. Peripheral pulses intact.  Abdomen: Soft,  Obese ,active bowel sounds. mild tenderness in the epigastrium. Without guarding and Without rebound. No masses. Rectal: Not evaluated Musculoskeletal: Full ROM, Normal gait. Without edema. Skin:  Dry and intact without significant lesions or  rashes Neuro: Alert and  oriented x4;  No focal deficits. Psych:  Cooperative. Normal mood and affect.    Vladimir Crofts, PA-C 3:26 PM

## 2022-04-18 ENCOUNTER — Encounter: Payer: Self-pay | Admitting: Physician Assistant

## 2022-04-18 ENCOUNTER — Ambulatory Visit: Payer: Medicare Other | Admitting: Physician Assistant

## 2022-04-18 VITALS — BP 150/74 | HR 76 | Ht 64.0 in | Wt 186.1 lb

## 2022-04-18 DIAGNOSIS — K582 Mixed irritable bowel syndrome: Secondary | ICD-10-CM

## 2022-04-18 DIAGNOSIS — K760 Fatty (change of) liver, not elsewhere classified: Secondary | ICD-10-CM

## 2022-04-18 DIAGNOSIS — H04123 Dry eye syndrome of bilateral lacrimal glands: Secondary | ICD-10-CM | POA: Diagnosis not present

## 2022-04-18 DIAGNOSIS — K219 Gastro-esophageal reflux disease without esophagitis: Secondary | ICD-10-CM

## 2022-04-18 DIAGNOSIS — Z9889 Other specified postprocedural states: Secondary | ICD-10-CM | POA: Diagnosis not present

## 2022-04-18 DIAGNOSIS — Z1211 Encounter for screening for malignant neoplasm of colon: Secondary | ICD-10-CM | POA: Diagnosis not present

## 2022-04-18 DIAGNOSIS — H40013 Open angle with borderline findings, low risk, bilateral: Secondary | ICD-10-CM | POA: Diagnosis not present

## 2022-04-18 DIAGNOSIS — Z961 Presence of intraocular lens: Secondary | ICD-10-CM | POA: Diagnosis not present

## 2022-04-18 MED ORDER — FAMOTIDINE 20 MG PO TABS: ORAL_TABLET | ORAL | 3 refills | Status: DC

## 2022-04-18 NOTE — Patient Instructions (Addendum)
Can continue pepcid daily a month and then as needed.  Avoid spicy and acidic foods Avoid fatty foods Limit your intake of coffee, tea, alcohol, and carbonated drinks Work to maintain a healthy weight Keep the head of the bed elevated at least 3 inches with blocks or a wedge pillow if you are having any nighttime symptoms Stay upright for 2 hours after eating Avoid meals and snacks three to four hours before bedtime  If worse, call the office/get barium swallow  Metabolic dysfunction associated seatohepatitis  Monitor LFTs and platelets every 6 months Now the leading cause of liver failure in the [place].  It is normally from such risk factors as obesity, diabetes, insulin resistance, high cholesterol, or metabolic syndrome.  The only definitive therapy is weight loss and exercise.   Suggest walking 20-30 mins daily.  Decreasing carbohydrates, increasing veggies.    Fatty Liver Fatty liver is the accumulation of fat in liver cells. It is also called hepatosteatosis or steatohepatitis. It is normal for your liver to contain some fat. If fat is more than 5 to 10% of your liver's weight, you have fatty liver.  There are often no symptoms (problems) for years while damage is still occurring. People often learn about their fatty liver when they have medical tests for other reasons. Fat can damage your liver for years or even decades without causing problems. When it becomes severe, it can cause fatigue, weight loss, weakness, and confusion. This makes you more likely to develop more serious liver problems. The liver is the largest organ in the body. It does a lot of work and often gives no warning signs when it is sick until late in a disease. The liver has many important jobs including: Breaking down foods. Storing vitamins, iron, and other minerals. Making proteins. Making bile for food digestion. Breaking down many products including medications, alcohol and some  poisons.  PROGNOSIS  Fatty liver may cause no damage or it can lead to an inflammation of the liver. This is, called steatohepatitis.  Over time the liver may become scarred and hardened. This condition is called cirrhosis. Cirrhosis is serious and may lead to liver failure or cancer. NASH is one of the leading causes of cirrhosis. About 10-20% of Americans have fatty liver and a smaller 2-5% has NASH.  TREATMENT  Weight loss, fat restriction, and exercise in overweight patients produces inconsistent results but is worth trying. Good control of diabetes may reduce fatty liver. Eat a balanced, healthy diet. Increase your physical activity. There are no medical or surgical treatments for a fatty liver or NASH, but improving your diet and increasing your exercise may help prevent or reverse some of the damage.

## 2022-05-03 DIAGNOSIS — N200 Calculus of kidney: Secondary | ICD-10-CM

## 2022-05-03 DIAGNOSIS — R319 Hematuria, unspecified: Secondary | ICD-10-CM | POA: Diagnosis not present

## 2022-05-03 HISTORY — DX: Calculus of kidney: N20.0

## 2022-05-07 ENCOUNTER — Other Ambulatory Visit: Payer: Self-pay | Admitting: Endocrinology

## 2022-05-07 DIAGNOSIS — N200 Calculus of kidney: Secondary | ICD-10-CM

## 2022-05-08 ENCOUNTER — Ambulatory Visit
Admission: RE | Admit: 2022-05-08 | Discharge: 2022-05-08 | Disposition: A | Discharge status: Home / Self Care | Payer: Medicare Other | Source: Ambulatory Visit | Attending: Endocrinology | Admitting: Endocrinology

## 2022-05-08 DIAGNOSIS — I700 Atherosclerosis of aorta: Secondary | ICD-10-CM | POA: Diagnosis not present

## 2022-05-08 DIAGNOSIS — Z87442 Personal history of urinary calculi: Secondary | ICD-10-CM | POA: Diagnosis not present

## 2022-05-08 DIAGNOSIS — Z90710 Acquired absence of both cervix and uterus: Secondary | ICD-10-CM | POA: Diagnosis not present

## 2022-05-08 DIAGNOSIS — N200 Calculus of kidney: Secondary | ICD-10-CM

## 2022-05-08 DIAGNOSIS — K5730 Diverticulosis of large intestine without perforation or abscess without bleeding: Secondary | ICD-10-CM | POA: Diagnosis not present

## 2022-05-17 DIAGNOSIS — N200 Calculus of kidney: Secondary | ICD-10-CM | POA: Diagnosis not present

## 2022-05-22 DIAGNOSIS — D649 Anemia, unspecified: Secondary | ICD-10-CM | POA: Diagnosis not present

## 2022-05-22 DIAGNOSIS — R42 Dizziness and giddiness: Secondary | ICD-10-CM | POA: Diagnosis not present

## 2022-05-22 DIAGNOSIS — I10 Essential (primary) hypertension: Secondary | ICD-10-CM | POA: Diagnosis not present

## 2022-05-22 DIAGNOSIS — K922 Gastrointestinal hemorrhage, unspecified: Secondary | ICD-10-CM | POA: Diagnosis not present

## 2022-05-25 ENCOUNTER — Telehealth: Payer: Self-pay | Admitting: Physician Assistant

## 2022-05-25 NOTE — Telephone Encounter (Signed)
PT is calling to get an urgent appointment. Her hemaglobin is at 8 and she has dark stools along with dizziness. Please advise.

## 2022-05-28 DIAGNOSIS — D649 Anemia, unspecified: Secondary | ICD-10-CM | POA: Diagnosis not present

## 2022-05-28 NOTE — Telephone Encounter (Signed)
Thank you :)

## 2022-05-28 NOTE — Telephone Encounter (Signed)
Spoke with Vanessa Skinner and scheduled her to see Marchelle Folks tomorrow pm at 1:15pm. Vanessa Skinner to arrive at 1pm and she is aware of appt.

## 2022-05-28 NOTE — Telephone Encounter (Signed)
Pt states she saw her PCP last week and her Hgb was 8. Reports she is having dark stools and feels weak. PCP placed her on Protonix BID and she does feel better than she did on Friday but still has to be careful when she stands up due to getting dizzy. She also was told to stop her baby asa. Pt is not on iron and has not taken any pepto. There are no soon appts. Please advise.

## 2022-05-28 NOTE — Progress Notes (Unsigned)
05/29/2022 Vanessa Skinner [id number] 1945-10-08  Referring provider: Adrian Prince, MD Primary GI doctor: Dr. Lavon Paganini (Dr. Juanda Chance)  ASSESSMENT AND PLAN: Anemia, unspecified type New onset anemia, went from HGB 13.8 on 04/04 to HGB of 9.5 on 04/09 with dark loose stools and SOB in between.  + FOBT here in the office, brown stool.  She has not been on iron supplement The patient is hemodynamically stable.  -will get iron, ferritin, CBC, and CMET stat -Depending on the iron indices and if the patient can not tolerate oral iron, may need to consider iron transfusion outpatient  -ER precautions discussed with the patient: severe weakness/dizziness, severe AB pain, vomit blood, shortness of breath or chest pain.  -Discussed with patient that we normally suggest EGD/Colon, however she prefers to not get colonoscopy at this time.  -Long discussion, with dark stools and acuity with epigastric pain, will start with EGD so we can get this sooner, with understanding if it is negative she will have to return for colonoscopy.  Risk of bowel prep, conscious sedation, and EGD were discussed. Risks include but are not limited to dehydration, pain, bleeding, cardiopulmonary process, bowel perforation, or other possible adverse outcomes.. Treatment plan was discussed with patient, and agreed upon.  Gastroesophageal reflux disease without esophagitis Continue protonix twice a day Stay off iron ER precautions discussed  Screen for colon cancer Had normal colonoscopy 2012 Negative cologuard 5 years ago Suggested colonoscopy, declines at this time, understands if EGD negative will need colonoscopy.    Patient Care Team: Adrian Prince, MD as PCP - General (Endocrinology)  HISTORY OF PRESENT ILLNESS: 77 y.o. female with a past medical history of chronic GERD, colonic diverticulosis. Irritable bowel syndrome and others listed below presents for evaluation of GERD, fatty liver, IBS.   Colonoscopy  November 2012 showed diverticulosis and rectal polyp that was removed with actually prolapsed mucosa on biopsy results due for recall colonoscopy 12/2020 Abdominal ultrasound 2011 showed fatty liver, CBD 3.6 cm.   HIDA scan with normal EF 95%   04/29/2019 last office visit with Dr. Lavon Paganini for abdominal discomfort, given dicyclomine 20 mg daily, Pepcid 20 mg twice daily as needed to avoid long-term PPI use.   04/18/2022 office visit with myself for follow-up of long-term issues GERD, fatty liver and IBS.  Was doing well at that time without dysphagia or melena.  Taking Pepcid as needed.  Discussed recall colonoscopy which she was due for 12/2020 and declined any further colon cancer screening at that time.  She was having gross hematuria March 21st with increasing flank pain. Hematuria lasted 3 days and then resolved, urine was unremarkable, had zofran, urinary medicine, and pain medication from Dr. Evlyn Kanner.  Had history of kidney stones, [facility], Xray unremarkable.  CBC done with [facility] 05/17/2022 hemoglobin was 13.8, urine normal.   She began to have very dark brown stools with diarrhea on 04/05 and upper AB pain.  That evening started to feel SOB.  Saw Dr. Evlyn Kanner Tuesday and Hemoglobin of 9.5 on 05/22/2022. Dr. Evlyn Kanner instructed patient to hold aspirin, started on Protonix 40 mg twice daily.  She has not been iron or anything to help her HGB.  She states she is feeling better, no more SOB, no dizziness.  She has some SOB with bending over.  She denies GERD, nausea, vomiting.  The AB pain has resolved.   No NSAIDS, 5 days of percocet, then tramadol. She has glass of wine about 2  a week.   Had negative cologuard  about 5 years.  No anemia.  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 She has never used smokeless tobacco. She reports current alcohol use of about 1.0 standard drink of alcohol per week. She reports that she  does not use drugs.      Current Medications:   Current Outpatient Medications (Endocrine & Metabolic):    levothyroxine (SYNTHROID) 88 MCG tablet, Take 88 mcg by mouth daily before breakfast.  Current Outpatient Medications (Cardiovascular):    aliskiren (TEKTURNA) 150 MG tablet, Take 150 mg by mouth daily.     ezetimibe (ZETIA) 10 MG tablet, Take 10 mg by mouth daily.   furosemide (LASIX) 20 MG tablet, Take 20 mg by mouth daily. swelling   pravastatin (PRAVACHOL) 40 MG tablet, Take 40 mg by mouth daily.     Current Outpatient Medications (Analgesics):    colchicine 0.6 MG tablet, Take 0.6 mg by mouth as needed.   acetaminophen (TYLENOL 8 HOUR ARTHRITIS PAIN) 650 MG CR tablet, Take 650 mg by mouth as needed. (Patient not taking: Reported on 05/29/2022)   Current Outpatient Medications (Other):    carboxymethylcellulose (REFRESH PLUS) 0.5 % SOLN, Place 1 drop into both eyes daily as needed.   Cholecalciferol (REPLESTA) 1.25 MG (50000 UT) WAFR, Take 1 Wafer by mouth once a week.   diclofenac Sodium (VOLTAREN) 1 % GEL, Apply 2 g topically as needed.   dicyclomine (BENTYL) 10 MG capsule, Take 1 capsule (10 mg total) by mouth in the morning and at bedtime.   famotidine (PEPCID) 20 MG tablet, TAKE ONE TABLET BY MOUTH TWICE A DAY AS NEEDED FOR HEARTBURN OR INDIGESTION   methocarbamol (ROBAXIN) 500 MG tablet, Take 500 mg by mouth as needed.   pantoprazole (PROTONIX) 40 MG tablet, Take 40 mg by mouth 2 (two) times daily.   prednisoLONE acetate (PRED FORTE) 1 % ophthalmic suspension, Place 1 drop into the right eye 2 (two) times daily.   Probiotic Product (ALIGN PO), Take 1 tablet by mouth as needed.     sodium chloride (MURO 128) 5 % ophthalmic ointment, Place 1 Application into both eyes at bedtime.   ALPRAZolam (XANAX) 0.5 MG tablet, Take 0.5 mg by mouth at bedtime as needed.   (Patient not taking: Reported on 05/29/2022)  Medical History:  Past Medical History:  Diagnosis Date    Allergy    seasonal   Anxiety    occasional   Arthritis    Cataract    Diverticulosis    DJD (degenerative joint disease)    Fatty liver disease, nonalcoholic    Gastritis    GERD (gastroesophageal reflux disease)    Gout    Hearing impaired    Heart murmur    MVP   Hemorrhoids    Hyperlipidemia    Hypertension    Hypothyroidism    Kidney stone 05/03/2022   Nephrolithiasis    Osteopenia    Thyroid disease    Allergies:  Allergies  Allergen Reactions   Epinephrine     REACTION: LARGE AMOUNT/heart races   Kenalog  [Triamcinolone Acetonide]    Morphine     REACTION: rash/itching   Propoxyphene Hcl     REACTION: nausea   Sulfonamide Derivatives     REACTION: Rash/itching     Surgical History:  She  has a past surgical history that includes Dilation and curettage of uterus (1975); Ovarian cyst surgery (1968);  Hand surgery (Right, 1998); Total abdominal hysterectomy w/ bilateral salpingoophorectomy (1987); Knee arthroscopy (Right, 1995); Wrist fracture surgery (Left, 2001); Colonoscopy; arm surgery (Left); Tubal ligation; Knee arthroscopy (Right); and Tonsillectomy. Family History:  Her family history includes Breast cancer in her maternal aunt; Colon cancer in her maternal grandmother; Diabetes in her father; Heart disease in her father, maternal grandfather, and mother; Kidney disease in her father.  REVIEW OF SYSTEMS  : All other systems reviewed and negative except where noted in the History of Present Illness.  PHYSICAL EXAM: BP 120/66   Pulse 79   Ht  (1.626 m)   Wt 179 lb (81.2 kg)   BMI 30.73 kg/m  General Appearance: Well nourished, in no apparent distress. Head:   Normocephalic and atraumatic. Eyes:  sclerae anicteric,conjunctive pink  Respiratory: Respiratory effort normal, BS equal bilaterally without rales, rhonchi, wheezing. Cardio: RRR with no MRGs. Peripheral pulses intact.  Abdomen: Soft,  Obese ,active bowel sounds. mild tenderness in the  epigastrium. Without guarding and Without rebound. No masses. Rectal: Not evaluated Musculoskeletal: Full ROM, Normal gait. Without edema. Skin:  Dry and intact without significant lesions or rashes Neuro: Alert and  oriented x4;  No focal deficits. Psych:  Cooperative. Normal mood and affect.    Doree Albee, PA-C 2:08 PM

## 2022-05-29 ENCOUNTER — Encounter: Payer: Self-pay | Admitting: Physician Assistant

## 2022-05-29 ENCOUNTER — Other Ambulatory Visit (INDEPENDENT_AMBULATORY_CARE_PROVIDER_SITE_OTHER): Payer: Medicare Other

## 2022-05-29 ENCOUNTER — Ambulatory Visit: Payer: Medicare Other | Admitting: Physician Assistant

## 2022-05-29 DIAGNOSIS — Z1211 Encounter for screening for malignant neoplasm of colon: Secondary | ICD-10-CM | POA: Diagnosis not present

## 2022-05-29 DIAGNOSIS — D649 Anemia, unspecified: Secondary | ICD-10-CM | POA: Diagnosis not present

## 2022-05-29 DIAGNOSIS — K582 Mixed irritable bowel syndrome: Secondary | ICD-10-CM | POA: Diagnosis not present

## 2022-05-29 DIAGNOSIS — K219 Gastro-esophageal reflux disease without esophagitis: Secondary | ICD-10-CM

## 2022-05-29 LAB — COMPREHENSIVE METABOLIC PANEL
ALT: 11 U/L (ref 0–35)
AST: 12 U/L (ref 0–37)
Albumin: 3.9 g/dL (ref 3.5–5.2)
Alkaline Phosphatase: 48 U/L (ref 39–117)
BUN: 21 mg/dL (ref 6–23)
CO2: 25 mEq/L (ref 19–32)
Calcium: 9.2 mg/dL (ref 8.4–10.5)
Chloride: 103 mEq/L (ref 96–112)
Creatinine, Ser: 1.01 mg/dL (ref 0.40–1.20)
GFR: 53.26 mL/min — ABNORMAL LOW (ref >60.00)
Glucose, Bld: 90 mg/dL (ref 70–99)
Potassium: 4.1 mEq/L (ref 3.5–5.1)
Sodium: 137 mEq/L (ref 135–145)
Total Bilirubin: 0.4 mg/dL (ref 0.2–1.2)
Total Protein: 7.1 g/dL (ref 6.0–8.3)

## 2022-05-29 LAB — CBC WITH DIFFERENTIAL/PLATELET
Basophils Absolute: 0.1 10*3/uL (ref 0.0–0.1)
Basophils Relative: 0.9 % (ref 0.0–3.0)
Eosinophils Absolute: 0 10*3/uL (ref 0.0–0.7)
Eosinophils Relative: 0.6 % (ref 0.0–5.0)
HCT: 28.9 % — ABNORMAL LOW (ref 36.0–46.0)
Hemoglobin: 9.8 g/dL — ABNORMAL LOW (ref 12.0–15.0)
Lymphocytes Relative: 23.1 % (ref 12.0–46.0)
Lymphs Abs: 1.8 10*3/uL (ref 0.7–4.0)
MCHC: 33.7 g/dL (ref 30.0–36.0)
MCV: 90.4 fl (ref 78.0–100.0)
Monocytes Absolute: 0.6 10*3/uL (ref 0.1–1.0)
Monocytes Relative: 8.2 % (ref 3.0–12.0)
Neutro Abs: 5.1 10*3/uL (ref 1.4–7.7)
Neutrophils Relative %: 67.2 % (ref 43.0–77.0)
Platelets: 390 10*3/uL (ref 150.0–400.0)
RBC: 3.2 Mil/uL — ABNORMAL LOW (ref 3.87–5.11)
RDW: 16.2 % — ABNORMAL HIGH (ref 11.5–15.5)
WBC: 7.6 10*3/uL (ref 4.0–10.5)

## 2022-05-29 LAB — IBC + FERRITIN
Ferritin: 201.4 ng/mL (ref 10.0–291.0)
Iron: 41 ug/dL — ABNORMAL LOW (ref 42–145)
Saturation Ratios: 11.4 % — ABNORMAL LOW (ref 20.0–50.0)
TIBC: 361.2 ug/dL (ref 250.0–450.0)
Transferrin: 258 mg/dL (ref 212.0–360.0)

## 2022-05-29 NOTE — Patient Instructions (Signed)
_______________________________________________________  If your blood pressure at your visit was 140/90 or greater, please contact your primary care physician to follow up on this. _______________________________________________________  If you are age 79 or older, your body mass index should be between 23-30. Your Body mass index is 30.73 kg/m. If this is out of the aforementioned range listed, please consider follow up with your Primary Care Provider. _______________________________________________________  The [facility] providers would like to encourage you to use [facility] to communicate with providers for non-urgent requests or questions.  Due to long hold times on the telephone, sending your provider a message by [facility] may be a faster and more efficient way to get a response.  Please allow 48 business hours for a response.  Please remember that this is for non-urgent requests.  _______________________________________________________  Your provider has requested that you go to the basement level for lab work before leaving today. Press "B" on the elevator. The lab is located at the first door on the left as you exit the elevator.  You have been scheduled for an endoscopy. Please follow written instructions given to you at your visit today. If you use inhalers (even only as needed), please bring them with you on the day of your procedure.  Due to recent changes in healthcare laws, you may see the results of your imaging and laboratory studies on MyChart before your provider has had a chance to review them.  We understand that in some cases there may be results that are confusing or concerning to you. Not all laboratory results come back in the same time frame and the provider may be waiting for multiple results in order to interpret others.  Please give [place] 48 hours in order for your provider to thoroughly review all the results before contacting the office for clarification of your results.    Thank you for entrusting me with your care and choosing [facility].  Quentin Mulling, PA-C

## 2022-05-29 NOTE — Progress Notes (Signed)
Reviewed and agree with documentation and assessment and plan. K. Veena Nahum Sherrer , MD   

## 2022-05-30 ENCOUNTER — Telehealth: Payer: Self-pay | Admitting: Physician Assistant

## 2022-05-30 NOTE — Telephone Encounter (Signed)
When labs are reviewed pt will be notified.

## 2022-05-30 NOTE — Telephone Encounter (Signed)
Patient called regarding lab work that was done yesterday 04/16. She is requesting a call back when her results are in to discuss and go over them.Please advise.   Thank you.

## 2022-05-31 NOTE — Telephone Encounter (Signed)
Patient calling again to follow up on lab results. Advised patient she will be notified when they are reviewed.

## 2022-06-04 DIAGNOSIS — R82998 Other abnormal findings in urine: Secondary | ICD-10-CM | POA: Diagnosis not present

## 2022-06-04 DIAGNOSIS — N200 Calculus of kidney: Secondary | ICD-10-CM | POA: Diagnosis not present

## 2022-06-04 DIAGNOSIS — R8279 Other abnormal findings on microbiological examination of urine: Secondary | ICD-10-CM | POA: Diagnosis not present

## 2022-06-15 ENCOUNTER — Encounter: Payer: Self-pay | Admitting: Gastroenterology

## 2022-06-15 ENCOUNTER — Ambulatory Visit (AMBULATORY_SURGERY_CENTER): Payer: Medicare Other | Admitting: Gastroenterology

## 2022-06-15 VITALS — BP 131/63 | HR 53 | Temp 96.8°F | Resp 14 | Ht 64.0 in | Wt 179.0 lb

## 2022-06-15 DIAGNOSIS — K2970 Gastritis, unspecified, without bleeding: Secondary | ICD-10-CM | POA: Diagnosis not present

## 2022-06-15 DIAGNOSIS — K315 Obstruction of duodenum: Secondary | ICD-10-CM

## 2022-06-15 DIAGNOSIS — K2950 Unspecified chronic gastritis without bleeding: Secondary | ICD-10-CM | POA: Diagnosis not present

## 2022-06-15 NOTE — Op Note (Signed)
[facility] Patient Name: Vanessa Skinner Procedure Date: 06/15/2022 10:15 AM MRN: [id number] Endoscopist: Napoleon Form , MD, [id number] Age: 77 Referring MD:  Date of Birth: 01-03-1946 Gender: Female Account #: [id number] Procedure:                Upper GI endoscopy Indications:              Suspected upper gastrointestinal bleeding in                            patient with unexplained iron deficiency anemia Medicines:                Monitored Anesthesia Care Procedure:                Pre-Anesthesia Assessment:                           - Prior to the procedure, a History and Physical                            was performed, and patient medications and                            allergies were reviewed. The patient's tolerance of                            previous anesthesia was also reviewed. The risks                            and benefits of the procedure and the sedation                            options and risks were discussed with the patient.                            All questions were answered, and informed consent                            was obtained. Prior Anticoagulants: The patient has                            taken no anticoagulant or antiplatelet agents. ASA                            Grade Assessment: II - A patient with mild systemic                            disease. After reviewing the risks and benefits,                            the patient was deemed in satisfactory condition to                            undergo the procedure.  After obtaining informed consent, the endoscope was                            passed under direct vision. Throughout the                            procedure, the patient's blood pressure, pulse, and                            oxygen saturations were monitored continuously. The                            GIF [id number] #[id number] was introduced through the                            mouth,  and advanced to the duodenal bulb. The upper                            GI endoscopy was technically difficult and complex                            due to stenosis. The patient tolerated the                            procedure well. Scope In: Scope Out: Findings:                 The Z-line was regular and was found 38 cm from the                            incisors.                           No gross lesions were noted in the entire esophagus.                           Patchy mild inflammation characterized by                            congestion (edema), erythema and friability was                            found in the entire examined stomach. Biopsies were                            taken with a cold forceps for Helicobacter pylori                            testing.                           The cardia and gastric fundus were normal on                            retroflexion.  Unable to extend the scope beyond duodenal bulb due                            to abnormal anatomy/ stenosis, limited                            visualization in the second portion of the duodenum                            and was non-traversed. Duodenal bulb appeared normal Complications:            No immediate complications. Estimated Blood Loss:     Estimated blood loss was minimal. Impression:               - Z-line regular, 38 cm from the incisors.                           - No gross lesions in the entire esophagus.                           - Gastritis. Biopsied.                           - Unable to extend the scope beyond duodenal bulb                            due to abnormal anatomy/ stenosis, limited                            visualization in the second portion of the duodenum                            and was non-traversed. Duodenal stenosis. Recommendation:           - Patient has a contact number available for                            emergencies. The signs  and symptoms of potential                            delayed complications were discussed with the                            patient. Return to normal activities tomorrow.                            Written discharge instructions were provided to the                            patient.                           - Resume previous diet.                           - Continue present medications.                           -  Await pathology results.                           - Schedule CT abd & pelvis with contrast                           - Follow up in GI office next available with                            Dr.Brandilyn Nanninga                           - Avoid NSAID's Napoleon Form, MD 06/15/2022 10:50:52 AM This report has been signed electronically.

## 2022-06-15 NOTE — Progress Notes (Unsigned)
[facility] History and Physical   Primary Care Physician:  Adrian Prince, MD   Reason for Procedure:  Iron deficiency anemia  Plan:    EGD  with possible interventions as needed     HPI: Vanessa Skinner is a very pleasant 77 y.o. female here for EGD for evaluation of iron deficiency anemia.  Please refer to office visit note by Quentin Mulling PA 05/30/22 for additional details  The risks and benefits as well as alternatives of endoscopic procedure(s) have been discussed and reviewed. All questions answered. The patient agrees to proceed.    Past Medical History:  Diagnosis Date   Allergy    seasonal   Anxiety    occasional   Arthritis    Cataract    Diverticulosis    DJD (degenerative joint disease)    Fatty liver disease, nonalcoholic    Gastritis    GERD (gastroesophageal reflux disease)    Gout    Hearing impaired    Heart murmur    MVP   Hemorrhoids    Hyperlipidemia    Hypertension    Hypothyroidism    Kidney stone 05/03/2022   Nephrolithiasis    Osteopenia    Thyroid disease     Past Surgical History:  Procedure Laterality Date   arm surgery Left    COLONOSCOPY     DILATION AND CURETTAGE OF UTERUS  1975   HAND SURGERY Right 1998   KNEE ARTHROSCOPY Right 1995   KNEE ARTHROSCOPY Right    OVARIAN CYST SURGERY  1968   TONSILLECTOMY     TOTAL ABDOMINAL HYSTERECTOMY W/ BILATERAL SALPINGOOPHORECTOMY  1987   TUBAL LIGATION     WRIST FRACTURE SURGERY Left 2001    Prior to Admission medications   Medication Sig Start Date End Date Taking? Authorizing Provider  acetaminophen (TYLENOL 8 HOUR ARTHRITIS PAIN) 650 MG CR tablet Take 650 mg by mouth as needed.   Yes [provider]  aliskiren (TEKTURNA) 150 MG tablet Take 150 mg by mouth daily.     Yes [provider]  ALPRAZolam Prudy Feeler) 0.5 MG tablet Take 0.5 mg by mouth at bedtime as needed.   Yes [provider]  colchicine 0.6 MG tablet Take 0.6 mg by mouth as  needed.   Yes [provider]  ezetimibe (ZETIA) 10 MG tablet Take 10 mg by mouth daily.   Yes [provider]  furosemide (LASIX) 20 MG tablet Take 20 mg by mouth daily. swelling   Yes [provider]  iron polysaccharides (NIFEREX) 150 MG capsule Take 150 mg by mouth daily.   Yes [provider]  levothyroxine (SYNTHROID) 88 MCG tablet Take 88 mcg by mouth daily before breakfast.   Yes [provider]  pantoprazole (PROTONIX) 40 MG tablet Take 40 mg by mouth 2 (two) times daily.   Yes [provider]  pravastatin (PRAVACHOL) 40 MG tablet Take 40 mg by mouth daily.     Yes [provider]  Probiotic Product (ALIGN PO) Take 1 tablet by mouth as needed.     Yes [provider]  carboxymethylcellulose (REFRESH PLUS) 0.5 % SOLN Place 1 drop into both eyes daily as needed. Patient not taking: Reported on 06/15/2022    [provider]  Cholecalciferol (REPLESTA) 1.25 MG (50000 UT) WAFR Take 1 Wafer by mouth once a week. Patient not taking: Reported on 06/15/2022 09/26/20   [provider]  diclofenac Sodium (VOLTAREN) 1 % GEL Apply 2 g topically as  needed. Patient not taking: Reported on 06/15/2022    [provider]  dicyclomine (BENTYL) 10 MG capsule Take 1 capsule (10 mg total) by mouth in the morning and at bedtime. Patient not taking: Reported on 06/15/2022 02/28/22   Napoleon Form, MD  famotidine (PEPCID) 20 MG tablet TAKE ONE TABLET BY MOUTH TWICE A DAY AS NEEDED FOR HEARTBURN OR INDIGESTION Patient not taking: Reported on 06/15/2022 04/18/22   Doree Albee, PA-C  methocarbamol (ROBAXIN) 500 MG tablet Take 500 mg by mouth as needed. Patient not taking: Reported on 06/15/2022    [provider]  sodium chloride (MURO 128) 5 % ophthalmic ointment Place 1 Application into both eyes at bedtime. Patient not taking: Reported on 06/15/2022 02/27/18   [provider]    Current Outpatient  Medications  Medication Sig Dispense Refill   acetaminophen (TYLENOL 8 HOUR ARTHRITIS PAIN) 650 MG CR tablet Take 650 mg by mouth as needed.     aliskiren (TEKTURNA) 150 MG tablet Take 150 mg by mouth daily.       ALPRAZolam (XANAX) 0.5 MG tablet Take 0.5 mg by mouth at bedtime as needed.     colchicine 0.6 MG tablet Take 0.6 mg by mouth as needed.     ezetimibe (ZETIA) 10 MG tablet Take 10 mg by mouth daily.     furosemide (LASIX) 20 MG tablet Take 20 mg by mouth daily. swelling     iron polysaccharides (NIFEREX) 150 MG capsule Take 150 mg by mouth daily.     levothyroxine (SYNTHROID) 88 MCG tablet Take 88 mcg by mouth daily before breakfast.     pantoprazole (PROTONIX) 40 MG tablet Take 40 mg by mouth 2 (two) times daily.     pravastatin (PRAVACHOL) 40 MG tablet Take 40 mg by mouth daily.       Probiotic Product (ALIGN PO) Take 1 tablet by mouth as needed.       carboxymethylcellulose (REFRESH PLUS) 0.5 % SOLN Place 1 drop into both eyes daily as needed. (Patient not taking: Reported on 06/15/2022)     Cholecalciferol (REPLESTA) 1.25 MG (50000 UT) WAFR Take 1 Wafer by mouth once a week. (Patient not taking: Reported on 06/15/2022)     diclofenac Sodium (VOLTAREN) 1 % GEL Apply 2 g topically as needed. (Patient not taking: Reported on 06/15/2022)     dicyclomine (BENTYL) 10 MG capsule Take 1 capsule (10 mg total) by mouth in the morning and at bedtime. (Patient not taking: Reported on 06/15/2022) 60 capsule 0   famotidine (PEPCID) 20 MG tablet TAKE ONE TABLET BY MOUTH TWICE A DAY AS NEEDED FOR HEARTBURN OR INDIGESTION (Patient not taking: Reported on 06/15/2022) 90 tablet 3   methocarbamol (ROBAXIN) 500 MG tablet Take 500 mg by mouth as needed. (Patient not taking: Reported on 06/15/2022)     sodium chloride (MURO 128) 5 % ophthalmic ointment Place 1 Application into both eyes at bedtime. (Patient not taking: Reported on 06/15/2022)     No current facility-administered medications for this visit.     Allergies as of 06/15/2022 - Review Complete 06/15/2022  Allergen Reaction Noted   Epinephrine  02/23/2010   Kenalog  [triamcinolone acetonide]  04/29/2019   Morphine  09/20/2009   Propoxyphene hcl  09/20/2009   Sulfonamide derivatives  09/20/2009    Family History  Problem Relation Age of Onset   Colon cancer Maternal Grandmother    Diabetes Father    Heart disease Father    Kidney disease  Father    Heart disease Mother    Heart disease Maternal Grandfather    Breast cancer Maternal Aunt    Esophageal cancer Neg Hx    Stomach cancer Neg Hx     Social History   Socioeconomic History   Marital status: Married    Spouse name: Not on file   Number of children: 2   Years of education: Not on file   Highest education level: Not on file  Occupational History   Occupation: Teacher, adult education: RETIRED    Comment: Retired  Tobacco Use   Smoking status: Never   Smokeless tobacco: Never  Vaping Use   Vaping Use: Never used  Substance and Sexual Activity   Alcohol use: Yes    Alcohol/week: 1.0 standard drink of alcohol    Types: 1 Glasses of wine per week   Drug use: No   Sexual activity: Not on file  Other Topics Concern   Not on file  Social History Narrative   Not on file   Social Determinants of Health   Financial Resource Strain: Not on file  Food Insecurity: Not on file  Transportation Needs: Not on file  Physical Activity: Not on file  Stress: Not on file  Social Connections: Not on file  Intimate Partner Violence: Not on file    Review of Systems:  All other review of systems negative except as mentioned in the HPI.  Physical Exam: Vital signs in last 24 hours: Blood Pressure (Abnormal) 158/82   Pulse 77   Temperature (Abnormal) 96.8 F (36 C) (Skin)   Respiration (Abnormal) 21   Height 5\' 4"  (1.626 m)   Weight 179 lb (81.2 kg)   Oxygen Saturation 98%   Body Mass Index 30.73 kg/m  General:   Alert, NAD Lungs:  Clear .   Heart:  Regular rate  and rhythm Abdomen:  Soft, nontender and nondistended. Neuro/Psych:  Alert and cooperative. Normal mood and affect. A and O x 3  Reviewed labs, radiology imaging, old records and pertinent past GI work up  Patient is appropriate for planned procedure(s) and anesthesia in an ambulatory setting   K. Scherry Ran , MD [phone]

## 2022-06-15 NOTE — Progress Notes (Signed)
Called to room to assist during endoscopic procedure.  Patient ID and intended procedure confirmed with present staff. Received instructions for my participation in the procedure from the performing physician.  

## 2022-06-15 NOTE — Progress Notes (Addendum)
Pt's states no medical or surgical changes since previsit or office visit. VS assessed by C.W 

## 2022-06-15 NOTE — Progress Notes (Unsigned)
Uneventful anesthetic. Report to pacu rn. Vss on [place] O2. Care resumed by rn. 

## 2022-06-15 NOTE — Patient Instructions (Signed)
AVOID ASPIRIN, ASPIRIN CONTAINING PRODUCTS (BC OR GOODY POWDERS) OR NSAIDS (IBUPROFEN, ADVIL, ALEVE, AND MOTRIN) FOR ; TYLENOL IS OK TO TAKE  CT to be scheduled   YOU HAD AN ENDOSCOPIC PROCEDURE TODAY AT THE [facility]:   Refer to the procedure report that was given to you for any specific questions about what was found during the examination.  If the procedure report does not answer your questions, please call your gastroenterologist to clarify.  If you requested that your care partner not be given the details of your procedure findings, then the procedure report has been included in a sealed envelope for you to review at your convenience later.  YOU SHOULD EXPECT: Some feelings of bloating in the abdomen. Passage of more gas than usual.  Walking can help get rid of the air that was put into your GI tract during the procedure and reduce the bloating. If you had a lower endoscopy (such as a colonoscopy or flexible sigmoidoscopy) you may notice spotting of blood in your stool or on the toilet paper. If you underwent a bowel prep for your procedure, you may not have a normal bowel movement for a few days.  Please Note:  You might notice some irritation and congestion in your nose or some drainage.  This is from the oxygen used during your procedure.  There is no need for concern and it should clear up in a day or so.  SYMPTOMS TO REPORT IMMEDIATELY:  Following upper endoscopy (EGD)  Vomiting of blood or coffee ground material  New chest pain or pain under the shoulder blades  Painful or persistently difficult swallowing  New shortness of breath  Fever of 100F or higher  Black, tarry-looking stools  For urgent or emergent issues, a gastroenterologist can be reached at any hour by calling [phone]. Do not use MyChart messaging for urgent concerns.    DIET:  We do recommend a small meal at first, but then you may proceed to your regular diet.  Drink plenty of fluids but you  should avoid alcoholic beverages for 24 hours.  ACTIVITY:  You should plan to take it easy for the rest of today and you should NOT DRIVE or use heavy machinery until tomorrow (because of the sedation medicines used during the test).    FOLLOW UP: Our staff will call the number listed on your records the next business day following your procedure.  We will call around 7:15- 8:00 am to check on you and address any questions or concerns that you may have regarding the information given to you following your procedure. If we do not reach you, we will leave a message.     If any biopsies were taken you will be contacted by phone or by letter within the next 1-3 weeks.  Please call us at [phone] if you have not heard about the biopsies in 3 weeks.    SIGNATURES/CONFIDENTIALITY: You and/or your care partner have signed paperwork which will be entered into your electronic medical record.  These signatures attest to the fact that that the information above on your After Visit Summary has been reviewed and is understood.  Full responsibility of the confidentiality of this discharge information lies with you and/or your care-partner.

## 2022-06-18 ENCOUNTER — Telehealth: Payer: Self-pay

## 2022-06-18 ENCOUNTER — Telehealth: Payer: Self-pay | Admitting: *Deleted

## 2022-06-18 DIAGNOSIS — K2970 Gastritis, unspecified, without bleeding: Secondary | ICD-10-CM

## 2022-06-18 DIAGNOSIS — K315 Obstruction of duodenum: Secondary | ICD-10-CM

## 2022-06-18 NOTE — Telephone Encounter (Signed)
  Follow up Call-     06/15/2022    9:17 AM  Call back number  Post procedure Call Back phone  # [phone]  Permission to leave phone message Yes     Patient questions:  Do you have a fever, pain , or abdominal swelling? No. Pain Score  0 *  Have you tolerated food without any problems? Yes.    Have you been able to return to your normal activities? Yes.    Do you have any questions about your discharge instructions: Diet   No. Medications  No. Follow up visit  No.  Do you have questions or concerns about your Care? No.  Actions: * If pain score is 4 or above: No action needed, pain <4.

## 2022-06-18 NOTE — Telephone Encounter (Signed)
Dr Lavon Paganini ordered a CT abd & pelvis with contrast after patient's upper GI done 06/15/2022. Kelani aware appointment set up for Monday June 10th at 11:00AM, arrive at 10:45AM, NPO 4 hours, [facility], [address] Shatiera aware to go get the contrast prior to her appointment.

## 2022-06-19 DIAGNOSIS — R7301 Impaired fasting glucose: Secondary | ICD-10-CM | POA: Diagnosis not present

## 2022-06-19 DIAGNOSIS — K760 Fatty (change of) liver, not elsewhere classified: Secondary | ICD-10-CM | POA: Diagnosis not present

## 2022-06-19 DIAGNOSIS — I700 Atherosclerosis of aorta: Secondary | ICD-10-CM | POA: Diagnosis not present

## 2022-06-19 DIAGNOSIS — E039 Hypothyroidism, unspecified: Secondary | ICD-10-CM | POA: Diagnosis not present

## 2022-06-19 DIAGNOSIS — M1A0710 Idiopathic chronic gout, right ankle and foot, without tophus (tophi): Secondary | ICD-10-CM | POA: Diagnosis not present

## 2022-06-22 ENCOUNTER — Encounter: Payer: Self-pay | Admitting: Gastroenterology

## 2022-07-18 ENCOUNTER — Telehealth: Payer: Self-pay | Admitting: Gastroenterology

## 2022-07-18 NOTE — Telephone Encounter (Signed)
Inbound call from patient requesting a call back to discuss CT with contrast. States the location order was sent to will not be covered by insurance. States that CT will be covered if done at [facility]. Please advise, thank you.

## 2022-07-18 NOTE — Telephone Encounter (Signed)
I called and left her a detailed message to call [phone] to set up the CT scan at [facility].

## 2022-07-23 ENCOUNTER — Other Ambulatory Visit: Payer: Medicare Other

## 2022-07-23 NOTE — Telephone Encounter (Signed)
Appointment has been made for 08/07/2022 at 12:30pm

## 2022-07-25 DIAGNOSIS — L820 Inflamed seborrheic keratosis: Secondary | ICD-10-CM | POA: Diagnosis not present

## 2022-07-25 DIAGNOSIS — Z85828 Personal history of other malignant neoplasm of skin: Secondary | ICD-10-CM | POA: Diagnosis not present

## 2022-07-25 DIAGNOSIS — L821 Other seborrheic keratosis: Secondary | ICD-10-CM | POA: Diagnosis not present

## 2022-07-26 DIAGNOSIS — N390 Urinary tract infection, site not specified: Secondary | ICD-10-CM | POA: Diagnosis not present

## 2022-08-07 ENCOUNTER — Ambulatory Visit (HOSPITAL_COMMUNITY)
Admission: RE | Admit: 2022-08-07 | Discharge: 2022-08-07 | Disposition: A | Discharge status: Home / Self Care | Payer: Medicare Other | Source: Ambulatory Visit | Attending: Gastroenterology | Admitting: Gastroenterology

## 2022-08-07 ENCOUNTER — Encounter (HOSPITAL_COMMUNITY): Payer: Self-pay

## 2022-08-07 DIAGNOSIS — K5730 Diverticulosis of large intestine without perforation or abscess without bleeding: Secondary | ICD-10-CM | POA: Diagnosis not present

## 2022-08-07 DIAGNOSIS — K838 Other specified diseases of biliary tract: Secondary | ICD-10-CM | POA: Diagnosis not present

## 2022-08-07 DIAGNOSIS — K315 Obstruction of duodenum: Secondary | ICD-10-CM

## 2022-08-07 DIAGNOSIS — K2970 Gastritis, unspecified, without bleeding: Secondary | ICD-10-CM | POA: Diagnosis not present

## 2022-08-07 DIAGNOSIS — N200 Calculus of kidney: Secondary | ICD-10-CM | POA: Diagnosis not present

## 2022-08-07 DIAGNOSIS — I708 Atherosclerosis of other arteries: Secondary | ICD-10-CM | POA: Diagnosis not present

## 2022-08-07 MED ORDER — IOHEXOL 300 MG/ML  SOLN
100.0000 mL | Freq: Once | INTRAMUSCULAR | Status: AC | PRN
  Administered 2022-08-07: 100 mL via INTRAVENOUS

## 2022-08-07 MED ORDER — SODIUM CHLORIDE (PF) 0.9 % IJ SOLN
INTRAMUSCULAR | Status: AC
  Filled 2022-08-07: qty 50

## 2022-08-09 DIAGNOSIS — I10 Essential (primary) hypertension: Secondary | ICD-10-CM | POA: Diagnosis not present

## 2022-08-15 DIAGNOSIS — N200 Calculus of kidney: Secondary | ICD-10-CM | POA: Diagnosis not present

## 2022-08-15 DIAGNOSIS — R350 Frequency of micturition: Secondary | ICD-10-CM | POA: Diagnosis not present

## 2022-08-29 DIAGNOSIS — H04123 Dry eye syndrome of bilateral lacrimal glands: Secondary | ICD-10-CM | POA: Diagnosis not present

## 2022-08-29 DIAGNOSIS — Z9889 Other specified postprocedural states: Secondary | ICD-10-CM | POA: Diagnosis not present

## 2022-08-29 DIAGNOSIS — H40013 Open angle with borderline findings, low risk, bilateral: Secondary | ICD-10-CM | POA: Diagnosis not present

## 2022-08-29 DIAGNOSIS — Z961 Presence of intraocular lens: Secondary | ICD-10-CM | POA: Diagnosis not present

## 2022-09-04 ENCOUNTER — Other Ambulatory Visit: Payer: Self-pay | Admitting: Gastroenterology

## 2022-09-11 DIAGNOSIS — K080 Exfoliation of teeth due to systemic causes: Secondary | ICD-10-CM | POA: Diagnosis not present

## 2022-10-03 NOTE — Progress Notes (Signed)
Vanessa Skinner    [id number]    07/07/1945  Primary Care Physician:South, Jeannett Senior, MD  Referring Physician: Adrian Prince, MD [address] [place],  [place] [address]   Chief complaint: Irritable bowel syndrome Chief Complaint  Patient presents with   Anemia   IDA   gastritis    F/U EGD. Patient using Bentyl. Patient stopped Protonix   Gastroesophageal Reflux    Foods trigger breakthrough    HPI: 77 year old female with chronic GERD, colonic diverticulosis.  She was previously followed by Dr. Lina Sar. Last seen on 05-29-22 by PA Quentin Mulling for anemia.   Today, she reports feeling well overall. Her reflux symptoms have improved since her last visit. She denies taking Pepcid or Protonix but reports taking dicyclomine 10 mg once daily. She denies having any complains with her colon or abdominal pain.   She denies having any issues with her BM and states that she typically feels completely evacuated. She is typically having a BM once daily. She states that she was experiencing minimal issues when she had a bladderstone few months back. She was having some pain when urinating but has now been resolved.  She reports that she was taking Iron supplements for Anemia till the end of June. We also reviewed her latest CT scan and discussed the result which was normal.  Patient denies diarrhea, constipation, nausea, blood in stool, black stool, vomiting, abdominal pain, bloating, unintentional weight loss, dysphagia.  GI Hx: CT Abdomen Pelvis w contrast 08-12-22 No acute findings in the abdomen or pelvis. Mild common bile duct dilatation, 10 mm, stable since prior study. No duodenal abnormality noted. Aortoiliac atherosclerosis  CT Renal Stone study 05-10-22 Asymmetric right perinephric stranding and minimal pelvicaliectasis. No ureteral calculi or other obstructing etiology visualized. Differential diagnosis includes pyelonephritis and recently passed stone.  Suggest correlation with urinalysis.   Colonic diverticulosis, without radiographic evidence of diverticulitis.  EGD 06-15-22 - Z-line regular, 38 cm from the incisors.  - No gross lesions in the entire esophagus.  - Gastritis. Biopsied.  - Unable to extend the scope beyond duodenal bulb due to abnormal anatomy/ stenosis, limited visualization in the second portion of the duodenum and was non-traversed. Duodenal stenosis. Surgical [P], gastric antrum and gastric body CHRONIC GASTRITIS WITH LYMPHOID AGGREGATES AND REACTIVE EPITHELIAL CHANGES HELICOBACTER STAIN NEGATIVE ([facility], ADEQUATE CONTROL) NEGATIVE FOR INTESTINAL METAPLASIA, DYSPLASIA AND CARCINOMA  Colonoscopy November 2012 showed diverticulosis and rectal polyp that was removed with actually prolapsed mucosa on biopsy results  Abdominal ultrasound 2011 showed fatty liver, CBD 3.6 cm.  HIDA scan with normal EF 95%  History of colon cancer    Current Outpatient Medications:    acetaminophen (TYLENOL 8 HOUR ARTHRITIS PAIN) 650 MG CR tablet, Take 650 mg by mouth as needed., Disp: , Rfl:    aliskiren (TEKTURNA) 150 MG tablet, Take 150 mg by mouth daily.  , Disp: , Rfl:    ALPRAZolam (XANAX) 0.5 MG tablet, Take 0.5 mg by mouth at bedtime as needed., Disp: , Rfl:    carboxymethylcellulose (REFRESH PLUS) 0.5 % SOLN, Place 1 drop into both eyes daily as needed., Disp: , Rfl:    Cholecalciferol (REPLESTA) 1.25 MG (50000 UT) WAFR, Take 1 Wafer by mouth once a week., Disp: , Rfl:    dicyclomine (BENTYL) 10 MG capsule, TAKE 1 CAPSULE BY MOUTH EVERY MORNING AND TAKE 1 CAPSULE BY MOUTH EVERY NIGHT AT BEDTIME, Disp: 60 capsule, Rfl: 0   ezetimibe (  ZETIA) 10 MG tablet, Take 10 mg by mouth daily., Disp: , Rfl:    furosemide (LASIX) 20 MG tablet, Take 20 mg by mouth daily. swelling, Disp: , Rfl:    levothyroxine (SYNTHROID) 88 MCG tablet, Take 88 mcg by mouth daily before breakfast., Disp: , Rfl:    methocarbamol (ROBAXIN) 500 MG tablet, Take 500 mg  by mouth as needed., Disp: , Rfl:    pravastatin (PRAVACHOL) 40 MG tablet, Take 40 mg by mouth daily.  , Disp: , Rfl:    Probiotic Product (ALIGN PO), Take 1 tablet by mouth as needed.  , Disp: , Rfl:    sodium chloride (MURO 128) 5 % ophthalmic ointment, Place 1 Application into both eyes at bedtime as needed., Disp: , Rfl:    colchicine 0.6 MG tablet, Take 0.6 mg by mouth as needed. (Patient not taking: Reported on 10/08/2022), Disp: , Rfl:    famotidine (PEPCID) 20 MG tablet, TAKE ONE TABLET BY MOUTH TWICE A DAY AS NEEDED FOR HEARTBURN OR INDIGESTION (Patient not taking: Reported on 06/15/2022), Disp: 90 tablet, Rfl: 3   pantoprazole (PROTONIX) 40 MG tablet, Take 40 mg by mouth 2 (two) times daily. (Patient not taking: Reported on 10/08/2022), Disp: , Rfl:     Allergies as of 10/08/2022 - Review Complete 10/08/2022  Allergen Reaction Noted   Epinephrine  02/23/2010   Kenalog  [triamcinolone acetonide]  04/29/2019   Morphine  09/20/2009   Propoxyphene hcl  09/20/2009   Sulfonamide derivatives  09/20/2009    Past Medical History:  Diagnosis Date   Allergy    seasonal   Anxiety    occasional   Arthritis    Cataract    Diverticulosis    DJD (degenerative joint disease)    Fatty liver disease, nonalcoholic    Gastritis    GERD (gastroesophageal reflux disease)    Gout    Hearing impaired    Heart murmur    MVP   Hemorrhoids    Hyperlipidemia    Hypertension    Hypothyroidism    Kidney stone 05/03/2022   Nephrolithiasis    Osteopenia    Thyroid disease     Past Surgical History:  Procedure Laterality Date   arm surgery Left    COLONOSCOPY     DILATION AND CURETTAGE OF UTERUS  1975   HAND SURGERY Right 1998   KNEE ARTHROSCOPY Right 1995   KNEE ARTHROSCOPY Right    OVARIAN CYST SURGERY  1968   TONSILLECTOMY     TOTAL ABDOMINAL HYSTERECTOMY W/ BILATERAL SALPINGOOPHORECTOMY  1987   TUBAL LIGATION     WRIST FRACTURE SURGERY Left 2001    Family History  Problem  Relation Age of Onset   Colon cancer Maternal Grandmother    Diabetes Father    Heart disease Father    Kidney disease Father    Heart disease Mother    Heart disease Maternal Grandfather    Breast cancer Maternal Aunt    Esophageal cancer Neg Hx    Stomach cancer Neg Hx     Social History   Socioeconomic History   Marital status: Married    Spouse name: Not on file   Number of children: 2   Years of education: Not on file   Highest education level: Not on file  Occupational History   Occupation: Teacher, adult education: RETIRED    Comment: Retired  Tobacco Use   Smoking status: Never   Smokeless tobacco: Never  Advertising account planner  Vaping status: Never Used  Substance and Sexual Activity   Alcohol use: Yes    Alcohol/week: 1.0 standard drink of alcohol    Types: 1 Glasses of wine per week    Comment: 3/week   Drug use: No   Sexual activity: Not on file  Other Topics Concern   Not on file  Social History Narrative   Not on file   Social Determinants of Health   Financial Resource Strain: Not on file  Food Insecurity: Not on file  Transportation Needs: Not on file  Physical Activity: Not on file  Stress: Not on file  Social Connections: Not on file  Intimate Partner Violence: Not on file    Review of systems:  Review of Systems  Constitutional:  Negative for unexpected weight change.  HENT:  Negative for trouble swallowing.   Gastrointestinal:  Negative for abdominal distention, abdominal pain, anal bleeding, blood in stool, constipation, diarrhea, nausea, rectal pain and vomiting.       +reflux     Physical Exam: Vitals:   10/08/22 0947  BP: 128/72  Pulse: (!) 54  SpO2: 98%    Body mass index is 30.9 kg/m. General: well-appearing   Eyes: sclera anicteric, no redness ENT: oral mucosa moist without lesions, no cervical or supraclavicular lymphadenopathy CV: RRR, no JVD, no peripheral edema Resp: clear to auscultation bilaterally, normal RR and effort  noted GI: soft, no tenderness, with active bowel sounds. No guarding or palpable organomegaly noted. Skin; warm and dry, no rash or jaundice noted Neuro: awake, alert and oriented x 3. Normal gross motor function and fluent speech   Data Reviewed:  Reviewed labs, radiology imaging, old records and pertinent past GI work up   Assessment and Plan/Recommendations:  77 year old female with chronic irritable syndrome, severe diverticulosis here for follow-up visit for iron deficiency anemia  Iron deficiency has improved with oral iron supplements Past due for surveillance colonoscopy, advised patient to schedule it, she wants to wait till January 2025, reminder placed Will need to exclude any neoplastic lesion The risks and benefits as well as alternatives of endoscopic procedure(s) have been discussed and reviewed. All questions answered. The patient agrees to proceed.  Follow-up hemoglobin and hematocrit  Chronic IBS associated with diverticular disease Discussed high-fiber diet and increasing fluid intake Use dicyclomine 10 mg twice daily as needed  GERD: Discussed antireflux measures and lifestyle modifications Rx Pepcid 20 mg twice daily as needed Patient is trying to avoid long-term PPI use   Return in 1 year or sooner if needed   The patient was provided an opportunity to ask questions and all were answered. The patient agreed with the plan and demonstrated an understanding of the instructions.   I,Safa M Kadhim,acting as a scribe for Marsa Aris, MD.,have documented all relevant documentation on the behalf of Marsa Aris, MD,as directed by  Marsa Aris, MD while in the presence of Marsa Aris, MD.   I, Marsa Aris, MD, have reviewed all documentation for this visit. The documentation on 10/08/22 for the exam, diagnosis, procedures, and orders are all accurate and complete.   Iona Beard , MD    CC: Adrian Prince, MD

## 2022-10-08 ENCOUNTER — Ambulatory Visit: Payer: Medicare Other | Admitting: Gastroenterology

## 2022-10-08 ENCOUNTER — Other Ambulatory Visit (INDEPENDENT_AMBULATORY_CARE_PROVIDER_SITE_OTHER): Payer: Medicare Other

## 2022-10-08 ENCOUNTER — Encounter: Payer: Self-pay | Admitting: Gastroenterology

## 2022-10-08 VITALS — BP 128/72 | HR 54 | Ht 64.0 in | Wt 180.0 lb

## 2022-10-08 DIAGNOSIS — Z1211 Encounter for screening for malignant neoplasm of colon: Secondary | ICD-10-CM

## 2022-10-08 DIAGNOSIS — K2970 Gastritis, unspecified, without bleeding: Secondary | ICD-10-CM | POA: Diagnosis not present

## 2022-10-08 DIAGNOSIS — Z862 Personal history of diseases of the blood and blood-forming organs and certain disorders involving the immune mechanism: Secondary | ICD-10-CM

## 2022-10-08 LAB — HEMOGLOBIN: Hemoglobin: 14.5 g/dL (ref 12.0–15.0)

## 2022-10-08 LAB — HEMATOCRIT: HCT: 43.5 % (ref 36.0–46.0)

## 2022-10-08 MED ORDER — FAMOTIDINE 20 MG PO TABS: 20.0000 mg | ORAL_TABLET | Freq: Two times a day (BID) | ORAL | 11 refills | Status: DC

## 2022-10-08 NOTE — Patient Instructions (Addendum)
We have sent the following medications to your pharmacy for you to pick up at your convenience:  Pepcid  Your provider has requested that you go to the basement level for lab work before leaving today. Press "B" on the elevator. The lab is located at the first door on the left as you exit the elevator.   Colonoscopy recall changed to 02/2023, you will receive a reminder letter  Follow up as needed  _______________________________________________________  If your blood pressure at your visit was 140/90 or greater, please contact your primary care physician to follow up on this.  _______________________________________________________  If you are age 75 or older, your body mass index should be between 23-30. Your Body mass index is 30.9 kg/m. If this is out of the aforementioned range listed, please consider follow up with your Primary Care Provider.  If you are age 63 or younger, your body mass index should be between 19-25. Your Body mass index is 30.9 kg/m. If this is out of the aformentioned range listed, please consider follow up with your Primary Care Provider.   ________________________________________________________  The [facility] providers would like to encourage you to use [facility] to communicate with providers for non-urgent requests or questions.  Due to long hold times on the telephone, sending your provider a message by [facility] may be a faster and more efficient way to get a response.  Please allow 48 business hours for a response.  Please remember that this is for non-urgent requests.  _______________________________________________________   I appreciate the  opportunity to care for you  Thank You   Marsa Aris , MD

## 2022-10-16 ENCOUNTER — Other Ambulatory Visit: Payer: Self-pay | Admitting: Endocrinology

## 2022-10-16 DIAGNOSIS — Z1231 Encounter for screening mammogram for malignant neoplasm of breast: Secondary | ICD-10-CM

## 2022-10-18 DIAGNOSIS — M1711 Unilateral primary osteoarthritis, right knee: Secondary | ICD-10-CM | POA: Diagnosis not present

## 2022-10-19 ENCOUNTER — Encounter: Payer: Self-pay | Admitting: Gastroenterology

## 2022-10-22 ENCOUNTER — Encounter: Payer: Self-pay | Admitting: Obstetrics and Gynecology

## 2022-10-22 ENCOUNTER — Ambulatory Visit: Payer: Medicare Other | Admitting: Obstetrics and Gynecology

## 2022-10-22 VITALS — BP 147/84 | HR 74 | Ht 62.5 in | Wt 179.0 lb

## 2022-10-22 DIAGNOSIS — R102 Pelvic and perineal pain: Secondary | ICD-10-CM | POA: Diagnosis not present

## 2022-10-22 DIAGNOSIS — R350 Frequency of micturition: Secondary | ICD-10-CM

## 2022-10-22 DIAGNOSIS — N816 Rectocele: Secondary | ICD-10-CM

## 2022-10-22 DIAGNOSIS — L570 Actinic keratosis: Secondary | ICD-10-CM | POA: Diagnosis not present

## 2022-10-22 DIAGNOSIS — M62838 Other muscle spasm: Secondary | ICD-10-CM | POA: Diagnosis not present

## 2022-10-22 LAB — POCT URINALYSIS DIPSTICK
Bilirubin, UA: NEGATIVE
Blood, UA: NEGATIVE
Glucose, UA: NEGATIVE
Ketones, UA: NEGATIVE
Leukocytes, UA: NEGATIVE
Nitrite, UA: NEGATIVE
Protein, UA: NEGATIVE
Spec Grav, UA: 1.01 (ref 1.010–1.025)
Urobilinogen, UA: 0.2 U/dL
pH, UA: 7 (ref 5.0–8.0)

## 2022-10-22 NOTE — Progress Notes (Unsigned)
[facility] New Patient Evaluation and Consultation  Referring Provider: Adrian Prince, MD PCP: Adrian Prince, MD Date of Service: 10/22/2022  SUBJECTIVE Chief Complaint: New Patient (Initial Visit) (DEBRO HILT is a 77 y.o. female here for a prolapse consult.)  History of Present Illness: JULYSSA BLACKLER is a 77 y.o. White or Caucasian female presenting for evaluation of pelvic pressure.     Urinary Symptoms: Does not leak urine.  Has had urge incontinence in the past but not currently an issue.  Currently has a pressure sensation when she has to void- does not feel pain.   Day time voids 6-8.  Nocturia: 2-3 times per night to void. Voiding dysfunction: she empties her bladder well.  does not use a catheter to empty bladder.  When urinating, she feels a weak stream  UTIs: 1 UTI's in the last year.   Reports history of kidney or bladder stones- last treated in March at [facility]  Pelvic Organ Prolapse Symptoms:                  She Denies a feeling of a bulge the vaginal area. She was told previously that her bladder was dropped.   Bowel Symptom: Bowel movements: 1-2 time(s) per day Stool consistency: soft  Straining: no.  Splinting: no.  Incomplete evacuation: no.  She Admits to accidental bowel leakage / fecal incontinence  Occurs: "not often"  Consistency with leakage: soft  Bowel regimen: none Last colonoscopy: going back in January Recently had upper GI due to gastric bleed  Sexual Function Sexually active: yes.  Sexual orientation: Straight Pain with sex: No  Pelvic Pain Denies pelvic pain    Past Medical History:  Past Medical History:  Diagnosis Date   Allergy    seasonal   Anxiety    occasional   Arthritis    Cataract    Diverticulosis    DJD (degenerative joint disease)    Fatty liver disease, nonalcoholic    Gastritis    GERD (gastroesophageal reflux disease)    Gout    Hearing impaired    Heart murmur    MVP    Hemorrhoids    Hyperlipidemia    Hypertension    Hypothyroidism    Kidney stone 05/03/2022   Nephrolithiasis    Osteopenia    Thyroid disease      Past Surgical History:   Past Surgical History:  Procedure Laterality Date   arm surgery Left    COLONOSCOPY     DILATION AND CURETTAGE OF UTERUS  1975   HAND SURGERY Right 1998   KNEE ARTHROSCOPY Right 1995   KNEE ARTHROSCOPY Right    OVARIAN CYST SURGERY  1968   TONSILLECTOMY     TOTAL ABDOMINAL HYSTERECTOMY W/ BILATERAL SALPINGOOPHORECTOMY  1987   TUBAL LIGATION     WRIST FRACTURE SURGERY Left 2001     Past OB/GYN History: OB History  Gravida Para Term Preterm AB Living  3 1 1   1 2   SAB IAB Ectopic Multiple Live Births          2    # Outcome Date GA Lbr Len/2nd Weight Sex Type Anes PTL Lv  3 Gravida      Vag-Forceps     2 Term      Vag-Spont     1 AB            S/p hysterectomy   Medications: She has a current medication list which includes the  following prescription(s): acetaminophen, aliskiren, alprazolam, carboxymethylcellulose, replesta, dicyclomine, ezetimibe, famotidine, famotidine, furosemide, levothyroxine, methocarbamol, pravastatin, probiotic product, sodium chloride, colchicine, and pantoprazole.   Allergies: Patient is allergic to epinephrine, kenalog  [triamcinolone acetonide], morphine, propoxyphene hcl, and sulfonamide derivatives.   Social History:  Social History   Tobacco Use   Smoking status: Never   Smokeless tobacco: Never  Vaping Use   Vaping status: Never Used  Substance Use Topics   Alcohol use: Yes    Alcohol/week: 1.0 standard drink of alcohol    Types: 1 Glasses of wine per week    Comment: 3/week   Drug use: No    Relationship status: married She lives with husband.   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 Regular exercise: Yes: weekly yard work History of abuse: No  Family History:   Family History  Problem Relation Age of Onset   Colon cancer Maternal Grandmother     Diabetes Father    Heart disease Father    Kidney disease Father    Heart disease Mother    Heart disease Maternal Grandfather    Breast cancer Maternal Aunt    Esophageal cancer Neg Hx    Stomach cancer Neg Hx      Review of Systems: Review of Systems  Constitutional:  Negative for fever, malaise/fatigue and weight loss.  Respiratory:  Negative for cough, shortness of breath and wheezing.   Cardiovascular:  Negative for chest pain, palpitations and leg swelling.  Gastrointestinal:  Negative for abdominal pain and blood in stool.  Genitourinary:  Positive for dysuria.  Musculoskeletal:  Negative for myalgias.  Skin:  Negative for rash.  Neurological:  Negative for dizziness and headaches.  Endo/Heme/Allergies:  Does not bruise/bleed easily.  Psychiatric/Behavioral:  Negative for depression. The patient is not nervous/anxious.      OBJECTIVE Physical Exam: Vitals:   10/22/22 1017  BP: (!) 147/84  Pulse: 74  Weight: 179 lb (81.2 kg)  Height: 5' 2.5" (1.588 m)    Physical Exam Constitutional:      General: She is not in acute distress. Pulmonary:     Effort: Pulmonary effort is normal.  Abdominal:     General: There is no distension.     Palpations: Abdomen is soft.     Tenderness: There is no abdominal tenderness. There is no rebound.  Musculoskeletal:        General: No swelling. Normal range of motion.  Skin:    General: Skin is warm and dry.     Findings: No rash.  Neurological:     Mental Status: She is alert and oriented to person, place, and time.  Psychiatric:        Mood and Affect: Mood normal.        Behavior: Behavior normal.      GU / Detailed Urogynecologic Evaluation:  Pelvic Exam: Normal external female genitalia; Bartholin's and Skene's glands normal in appearance; urethral meatus normal in appearance, no urethral masses or discharge.   CST: negative  s/p hysterectomy: Speculum exam reveals normal vaginal mucosa with  atrophy and normal  vaginal cuff.  Adnexa no mass, fullness, tenderness.    Pelvic floor strength II/V  Pelvic floor musculature: Right levator tender, Right obturator tender, Left levator tender, Left obturator tender  POP-Q:   POP-Q  -3  Aa   -3                                           Ba  -6                                              C   3                                            Gh  3.5                                            Pb  7                                            tvl   -1                                            Ap  -1                                            Bp                                                 D      Rectal Exam:  Normal external rectum  Post-Void Residual (PVR) by Bladder Scan: In order to evaluate bladder emptying, we discussed obtaining a postvoid residual and she agreed to this procedure.  Procedure: The ultrasound unit was placed on the patient's abdomen in the suprapubic region after the patient had voided. A PVR of 87 ml was obtained by bladder scan.  Laboratory Results: POC urine: negative   ASSESSMENT AND PLAN Ms. Pean is a 77 y.o. with:  1. Urinary frequency   2. Pelvic pressure in female   3. Levator spasm    Pelvic PT   Marguerita Beards, MD   Medical Decision Making:  - Reviewed/ ordered a clinical laboratory test - Reviewed/ ordered a radiologic study - Reviewed/ ordered medicine test - Decision to obtain old records - Discussion of management of or test interpretation with an external physician / other healthcare professional  - Assessment requiring independent historian - Review and summation of prior records - Independent review of image, tracing or specimen

## 2022-10-25 DIAGNOSIS — M1711 Unilateral primary osteoarthritis, right knee: Secondary | ICD-10-CM | POA: Diagnosis not present

## 2022-10-26 DIAGNOSIS — R519 Headache, unspecified: Secondary | ICD-10-CM | POA: Diagnosis not present

## 2022-10-26 DIAGNOSIS — R6884 Jaw pain: Secondary | ICD-10-CM | POA: Diagnosis not present

## 2022-11-01 DIAGNOSIS — M1711 Unilateral primary osteoarthritis, right knee: Secondary | ICD-10-CM | POA: Diagnosis not present

## 2022-11-07 ENCOUNTER — Telehealth: Payer: Self-pay | Admitting: Gastroenterology

## 2022-11-07 NOTE — Telephone Encounter (Addendum)
I reviewed the note from her visit in August. The colonoscopy is past due. She had originally wanted to wait until January. If she has changed her mind, it is okay to schedule her colonoscopy for November. She will need to have a pre-visit to receive instructions since it was not done at her visit.

## 2022-11-07 NOTE — Telephone Encounter (Signed)
Inbound call from patient requesting to know if she is able to have colonoscopy in November rather than January. Please advise on scheduling. Thank you.

## 2022-11-08 ENCOUNTER — Encounter: Payer: Self-pay | Admitting: Gastroenterology

## 2022-11-22 ENCOUNTER — Other Ambulatory Visit: Payer: Self-pay | Admitting: Gastroenterology

## 2022-11-24 DIAGNOSIS — Z23 Encounter for immunization: Secondary | ICD-10-CM | POA: Diagnosis not present

## 2022-11-26 ENCOUNTER — Ambulatory Visit: Payer: Medicare Other | Attending: Cardiovascular Disease | Admitting: Cardiovascular Disease

## 2022-11-26 ENCOUNTER — Encounter: Payer: Self-pay | Admitting: Cardiovascular Disease

## 2022-11-26 VITALS — BP 160/80 | HR 76 | Ht 64.0 in | Wt 180.0 lb

## 2022-11-26 DIAGNOSIS — I10 Essential (primary) hypertension: Secondary | ICD-10-CM

## 2022-11-26 DIAGNOSIS — R9431 Abnormal electrocardiogram [ECG] [EKG]: Secondary | ICD-10-CM | POA: Diagnosis not present

## 2022-11-26 NOTE — Progress Notes (Signed)
Chief Complaint  Patient presents with   New Patient (Initial Visit)    Abnormal EKG   History of Present Illness: 77 yo female with history of GERD, hyperlipidemia, HTN and hypothyroidism who is here today as a new consult, referred by Dr. Evlyn Kanner, for the evaluation of abnormal EKG. She had seen Dr. Katrinka Blazing remotely for an abnormal EKG.   She tells me today that she feels well overall. She has no chest pain, dyspnea, palpitations, near syncope, syncope. No LE edema.   I also take care of her husband Nollie Shiflett.   Primary Care Physician: Adrian Prince, MD   Past Medical History:  Diagnosis Date   Allergy    seasonal   Anxiety    occasional   Arthritis    Cataract    Diverticulosis    DJD (degenerative joint disease)    Fatty liver disease, nonalcoholic    Gastritis    GERD (gastroesophageal reflux disease)    Gout    Hearing impaired    Heart murmur    MVP   Hemorrhoids    Hyperlipidemia    Hypertension    Hypothyroidism    Kidney stone 05/03/2022   Nephrolithiasis    Osteopenia    Thyroid disease     Past Surgical History:  Procedure Laterality Date   arm surgery Left    COLONOSCOPY     DILATION AND CURETTAGE OF UTERUS  1975   HAND SURGERY Right 1998   KNEE ARTHROSCOPY Right 1995   KNEE ARTHROSCOPY Right    OVARIAN CYST SURGERY  1968   TONSILLECTOMY     TOTAL ABDOMINAL HYSTERECTOMY W/ BILATERAL SALPINGOOPHORECTOMY  1987   TUBAL LIGATION     WRIST FRACTURE SURGERY Left 2001    Current Outpatient Medications  Medication Sig Dispense Refill   acetaminophen (TYLENOL 8 HOUR ARTHRITIS PAIN) 650 MG CR tablet Take 650 mg by mouth as needed.     aliskiren (TEKTURNA) 150 MG tablet Take 150 mg by mouth daily.       ALPRAZolam (XANAX) 0.5 MG tablet Take 0.5 mg by mouth at bedtime as needed.     carboxymethylcellulose (REFRESH PLUS) 0.5 % SOLN Place 1 drop into both eyes daily as needed.     Cholecalciferol (REPLESTA) 1.25 MG (50000 UT) WAFR Take 1 Wafer by  mouth once a week.     colchicine 0.6 MG tablet Take 0.6 mg by mouth as needed.     dicyclomine (BENTYL) 10 MG capsule TAKE 1 CAPSULE BY MOUTH EVERY MORNING AND TAKE 1 CAPSULE BY MOUTH EVERY NIGHT AT BEDTIME 60 capsule 0   ezetimibe (ZETIA) 10 MG tablet Take 10 mg by mouth daily.     famotidine (PEPCID) 20 MG tablet Take 1 tablet (20 mg total) by mouth 2 (two) times daily. 60 tablet 11   furosemide (LASIX) 20 MG tablet Take 20 mg by mouth daily. swelling     levothyroxine (SYNTHROID) 88 MCG tablet Take 88 mcg by mouth daily before breakfast.     methocarbamol (ROBAXIN) 500 MG tablet Take 500 mg by mouth as needed.     pravastatin (PRAVACHOL) 40 MG tablet Take 40 mg by mouth daily.       Probiotic Product (ALIGN PO) Take 1 tablet by mouth as needed.       sodium chloride (MURO 128) 5 % ophthalmic ointment Place 1 Application into both eyes at bedtime as needed.     No current facility-administered medications for this visit.  Allergies  Allergen Reactions   Epinephrine     REACTION: LARGE AMOUNT/heart races   Kenalog  [Triamcinolone Acetonide]    Latex    Morphine     REACTION: rash/itching   Propoxyphene Hcl     REACTION: nausea   Sulfonamide Derivatives     REACTION: Rash/itching    Social History   Socioeconomic History   Marital status: Married    Spouse name: Not on file   Number of children: 2   Years of education: Not on file   Highest education level: Not on file  Occupational History   Occupation: Teacher, adult education: RETIRED    Comment: Retired  Tobacco Use   Smoking status: Never   Smokeless tobacco: Never  Vaping Use   Vaping status: Never Used  Substance and Sexual Activity   Alcohol use: Yes    Alcohol/week: 1.0 standard drink of alcohol    Types: 1 Glasses of wine per week    Comment: 3/week   Drug use: No   Sexual activity: Yes  Other Topics Concern   Not on file  Social History Narrative   Not on file   Social Determinants of Health   Financial  Resource Strain: Not on file  Food Insecurity: Not on file  Transportation Needs: Not on file  Physical Activity: Not on file  Stress: Not on file  Social Connections: Not on file  Intimate Partner Violence: Not on file    Family History  Problem Relation Age of Onset   Heart disease Mother 43       heart attack   Diabetes Father    Heart disease Father 60       MI   Kidney disease Father    Colon cancer Maternal Grandmother    Heart disease Maternal Grandfather    Breast cancer Maternal Aunt    Esophageal cancer Neg Hx    Stomach cancer Neg Hx     Review of Systems:  As stated in the HPI and otherwise negative.   BP (!) 160/80 (BP Location: Left Arm, Patient Position: Sitting, Cuff Size: Normal)   Pulse 76   Ht 5\' 4"  (1.626 m)   Wt 81.6 kg   SpO2 95%   BMI 30.90 kg/m   Physical Examination: General: Well developed, well nourished, NAD  HEENT: OP clear, mucus membranes moist  SKIN: warm, dry. No rashes. Neuro: No focal deficits  Musculoskeletal: Muscle strength 5/5 all ext  Psychiatric: Mood and affect normal  Neck: No JVD, no carotid bruits, no thyromegaly, no lymphadenopathy.  Lungs:Clear bilaterally, no wheezes, rhonci, crackles Cardiovascular: Regular rate and rhythm. No murmurs, gallops or rubs. Abdomen:Soft. Bowel sounds present. Non-tender.  Extremities: No lower extremity edema. Pulses are 2 + in the bilateral DP/PT.  EKG:  EKG is ordered today. The ekg ordered today demonstrates  EKG Interpretation Date/Time:  Monday November 26 2022 11:15:05 EDT Ventricular Rate:  76 PR Interval:  160 QRS Duration:  90 QT Interval:  406 QTC Calculation: 456 R Axis:   25  Text Interpretation: Normal sinus rhythm Possible Left atrial enlargement Inferior infarct , age undetermined Anterolateral infarct , age undetermined No previous ECGs available Confirmed by Verne Carrow [phone]) on 11/26/2022 11:18:50 AM    Recent Labs: 05/29/2022: ALT 11; BUN 21;  Creatinine, Ser 1.01; Platelets 390.0; Potassium 4.1; Sodium 137 10/08/2022: Hemoglobin 14.5   Lipid Panel No results found for: "CHOL", "TRIG", "HDL", "CHOLHDL", "VLDL", "LDLCALC", "LDLDIRECT"   Wt Readings from  Last 3 Encounters:  11/26/22 81.6 kg  10/22/22 81.2 kg  10/08/22 81.6 kg    Assessment and Plan:   1. Abnormal EKG: No chest pain. Will arrange an echo to assess LV function and exclude structural heart disease.   2. HTN: Controlled at home. Followed in primary care  Labs/ tests ordered today include:   Orders Placed This Encounter  Procedures   EKG 12-Lead   ECHOCARDIOGRAM COMPLETE   Disposition:   F/U with me in one year   Signed, Verne Carrow, MD, [facility] 11/26/2022 11:53 AM    [facility] [address] [place], [place], [place]  [address] Phone: [phone]; Fax: [phone]

## 2022-11-26 NOTE — Patient Instructions (Signed)
Medication Instructions:  No changes *If you need a refill on your cardiac medications before your next appointment, please call your pharmacy*   Lab Work: none   Testing/Procedures: Your physician has requested that you have an echocardiogram. Echocardiography is a painless test that uses sound waves to create images of your heart. It provides your doctor with information about the size and shape of your heart and how well your heart's chambers and valves are working. This procedure takes approximately one hour. There are no restrictions for this procedure. Please do NOT wear cologne, perfume, aftershave, or lotions (deodorant is allowed). Please arrive 15 minutes prior to your appointment time.    Follow-Up: At [facility], you and your health needs are our priority.  As part of our continuing mission to provide you with exceptional heart care, we have created designated Provider Care Teams.  These Care Teams include your primary Cardiologist (physician) and Advanced Practice Providers (APPs -  Physician Assistants and Nurse Practitioners) who all work together to provide you with the care you need, when you need it.    Your next appointment:   12 month(s)  Provider:   Verne Carrow, MD

## 2022-11-27 ENCOUNTER — Ambulatory Visit: Payer: Medicare Other

## 2022-11-28 ENCOUNTER — Ambulatory Visit: Payer: Medicare Other

## 2022-12-03 ENCOUNTER — Ambulatory Visit
Admission: RE | Admit: 2022-12-03 | Discharge: 2022-12-03 | Disposition: A | Discharge status: Home / Self Care | Payer: Medicare Other | Source: Ambulatory Visit | Attending: Endocrinology | Admitting: Endocrinology

## 2022-12-03 DIAGNOSIS — Z1231 Encounter for screening mammogram for malignant neoplasm of breast: Secondary | ICD-10-CM

## 2022-12-06 DIAGNOSIS — H40013 Open angle with borderline findings, low risk, bilateral: Secondary | ICD-10-CM | POA: Diagnosis not present

## 2022-12-10 ENCOUNTER — Ambulatory Visit (AMBULATORY_SURGERY_CENTER): Payer: Medicare Other

## 2022-12-10 VITALS — Ht 64.0 in | Wt 178.0 lb

## 2022-12-10 DIAGNOSIS — Z1211 Encounter for screening for malignant neoplasm of colon: Secondary | ICD-10-CM

## 2022-12-10 MED ORDER — NA SULFATE-K SULFATE-MG SULF 17.5-3.13-1.6 GM/177ML PO SOLN: 1.0000 | Freq: Once | ORAL | 0 refills | Status: AC

## 2022-12-10 NOTE — Progress Notes (Signed)
No egg or soy allergy known to patient  No issues known to pt with past sedation with any surgeries or procedures Patient denies ever being told they had issues or difficulty with intubation  No FH of Malignant Hyperthermia Pt is not on diet pills Pt is not on  home 02  Pt is not on blood thinners  Pt denies issues with constipation  No A fib or A flutter Have any cardiac testing pending-- Routine ECHO 11/1 re-establishing care for cardiac baseline. No chest pain swelling or SOB.  LOA: independent  Prep: suprep  Patient's chart reviewed by Cathlyn Parsons CNRA prior to previsit and patient appropriate for the [facility].  Previsit completed and red dot placed by patient's name on their procedure day (on provider's schedule).     PV competed with patient. Prep instructions sent via mychart and home address. Goodrx coupon for [facility] provided to use for price reduction if needed.

## 2022-12-14 ENCOUNTER — Ambulatory Visit (HOSPITAL_COMMUNITY): Payer: Medicare Other | Attending: Cardiology

## 2022-12-14 DIAGNOSIS — R9431 Abnormal electrocardiogram [ECG] [EKG]: Secondary | ICD-10-CM | POA: Diagnosis not present

## 2022-12-14 LAB — ECHOCARDIOGRAM COMPLETE
Area-P 1/2: 2.73 cm2
Calc EF: 68.2 %
P 1/2 time: 424 ms
S' Lateral: 2.1 cm
Single Plane A2C EF: 66.6 %
Single Plane A4C EF: 69.5 %

## 2022-12-18 ENCOUNTER — Encounter: Payer: Self-pay | Admitting: Cardiovascular Disease

## 2022-12-20 ENCOUNTER — Encounter: Payer: Self-pay | Admitting: Gastroenterology

## 2022-12-23 ENCOUNTER — Encounter: Payer: Self-pay | Admitting: Certified Registered Nurse Anesthetist

## 2022-12-31 ENCOUNTER — Ambulatory Visit (AMBULATORY_SURGERY_CENTER): Payer: Medicare Other | Admitting: Gastroenterology

## 2022-12-31 ENCOUNTER — Encounter: Payer: Self-pay | Admitting: Gastroenterology

## 2022-12-31 VITALS — BP 125/68 | HR 61 | Temp 97.4°F | Resp 12 | Ht 64.0 in | Wt 178.0 lb

## 2022-12-31 DIAGNOSIS — Z1211 Encounter for screening for malignant neoplasm of colon: Secondary | ICD-10-CM

## 2022-12-31 MED ORDER — SODIUM CHLORIDE 0.9 % IV SOLN: 500.0000 mL | Freq: Once | INTRAVENOUS | Status: AC

## 2022-12-31 NOTE — Progress Notes (Signed)
Report given to PACU, vss 

## 2022-12-31 NOTE — Progress Notes (Signed)
[facility] History and Physical   Primary Care Physician:  Adrian Prince, MD   Reason for Procedure:  Colorectal cancer screening  Plan:    Screening colonoscopy with possible interventions as needed     HPI: Vanessa Skinner is a very pleasant 77 y.o. female here for screening colonoscopy. H/o iron deficiency anemia, improved with oral iron supplements Denies any nausea, vomiting, abdominal pain, melena or bright red blood per rectum  The risks and benefits as well as alternatives of endoscopic procedure(s) have been discussed and reviewed. All questions answered. The patient agrees to proceed.    Past Medical History:  Diagnosis Date   Allergy    seasonal   Anxiety    occasional   Arthritis    Cataract    Diverticulosis    DJD (degenerative joint disease)    Fatty liver disease, nonalcoholic    Gastritis    GERD (gastroesophageal reflux disease)    Gout    Hearing impaired    Heart murmur    MVP   Hemorrhoids    Hyperlipidemia    Hypertension    Hypothyroidism    Kidney stone 05/03/2022   Nephrolithiasis    Osteopenia    Thyroid disease     Past Surgical History:  Procedure Laterality Date   arm surgery Left    COLONOSCOPY     DILATION AND CURETTAGE OF UTERUS  1975   HAND SURGERY Right 1998   KNEE ARTHROSCOPY Right 1995   KNEE ARTHROSCOPY Right    OVARIAN CYST SURGERY  1968   TONSILLECTOMY     TOTAL ABDOMINAL HYSTERECTOMY W/ BILATERAL SALPINGOOPHORECTOMY  1987   TUBAL LIGATION     WRIST FRACTURE SURGERY Left 2001    Prior to Admission medications   Medication Sig Start Date End Date Taking? Authorizing Provider  acetaminophen (TYLENOL 8 HOUR ARTHRITIS PAIN) 650 MG CR tablet Take 650 mg by mouth as needed.   Yes [provider]  aliskiren (TEKTURNA) 150 MG tablet Take 300 mg by mouth daily.  Per patient 12/19/22 - recommended by Dr. Evlyn Kanner for elevated BP readings   Yes [provider]  ALPRAZolam Prudy Feeler) 0.5 MG tablet  Take 0.5 mg by mouth at bedtime as needed.   Yes [provider]  aspirin EC 81 MG tablet Take 81 mg by mouth daily.   Yes [provider]  carboxymethylcellulose (REFRESH PLUS) 0.5 % SOLN Place 1 drop into both eyes daily as needed.   Yes [provider]  Cholecalciferol (REPLESTA) 1.25 MG (50000 UT) WAFR Take 1 Wafer by mouth once a week. 09/26/20  Yes [provider]  dicyclomine (BENTYL) 10 MG capsule TAKE 1 CAPSULE BY MOUTH EVERY MORNING AND TAKE 1 CAPSULE BY MOUTH EVERY NIGHT AT BEDTIME 11/22/22  Yes Shawnise Peterkin V, MD  ezetimibe (ZETIA) 10 MG tablet Take 10 mg by mouth daily.   Yes [provider]  furosemide (LASIX) 20 MG tablet Take 20 mg by mouth daily. swelling   Yes [provider]  levothyroxine (SYNTHROID) 88 MCG tablet Take 88 mcg by mouth daily before breakfast.   Yes [provider]  pravastatin (PRAVACHOL) 40 MG tablet Take 40 mg by mouth daily.     Yes [provider]  colchicine 0.6 MG tablet Take 0.6 mg by mouth as needed.    [provider]  famotidine (PEPCID) 20 MG tablet Take 1 tablet (20 mg total) by mouth 2 (two) times daily. 10/08/22   Marsa Aris  V, MD  methocarbamol (ROBAXIN) 500 MG tablet Take 500 mg by mouth as needed.    [provider]  sodium chloride (MURO 128) 5 % ophthalmic ointment Place 1 Application into both eyes at bedtime as needed. 02/27/18   [provider]    Current Outpatient Medications  Medication Sig Dispense Refill   acetaminophen (TYLENOL 8 HOUR ARTHRITIS PAIN) 650 MG CR tablet Take 650 mg by mouth as needed.     aliskiren (TEKTURNA) 150 MG tablet Take 300 mg by mouth daily.  Per patient 12/19/22 - recommended by Dr. Evlyn Kanner for elevated BP readings     ALPRAZolam (XANAX) 0.5 MG tablet Take 0.5 mg by mouth at bedtime as needed.     aspirin EC 81 MG tablet Take 81 mg by mouth daily.     carboxymethylcellulose (REFRESH PLUS) 0.5 % SOLN Place  1 drop into both eyes daily as needed.     Cholecalciferol (REPLESTA) 1.25 MG (50000 UT) WAFR Take 1 Wafer by mouth once a week.     dicyclomine (BENTYL) 10 MG capsule TAKE 1 CAPSULE BY MOUTH EVERY MORNING AND TAKE 1 CAPSULE BY MOUTH EVERY NIGHT AT BEDTIME 60 capsule 0   ezetimibe (ZETIA) 10 MG tablet Take 10 mg by mouth daily.     furosemide (LASIX) 20 MG tablet Take 20 mg by mouth daily. swelling     levothyroxine (SYNTHROID) 88 MCG tablet Take 88 mcg by mouth daily before breakfast.     pravastatin (PRAVACHOL) 40 MG tablet Take 40 mg by mouth daily.       colchicine 0.6 MG tablet Take 0.6 mg by mouth as needed.     famotidine (PEPCID) 20 MG tablet Take 1 tablet (20 mg total) by mouth 2 (two) times daily. 60 tablet 11   methocarbamol (ROBAXIN) 500 MG tablet Take 500 mg by mouth as needed.     sodium chloride (MURO 128) 5 % ophthalmic ointment Place 1 Application into both eyes at bedtime as needed.     Current Facility-Administered Medications  Medication Dose Route Frequency Provider Last Rate Last Admin   0.9 %  sodium chloride infusion  500 mL Intravenous Once Napoleon Form, MD        Allergies as of 12/31/2022 - Review Complete 12/31/2022  Allergen Reaction Noted   Epinephrine  02/23/2010   Kenalog  [triamcinolone acetonide]  04/29/2019   Latex  11/26/2022   Morphine  09/20/2009   Propoxyphene hcl  09/20/2009   Sulfonamide derivatives  09/20/2009    Family History  Problem Relation Age of Onset   Heart disease Mother 69       heart attack   Diabetes Father    Heart disease Father 35       MI   Kidney disease Father    Breast cancer Maternal Aunt    Colon cancer Maternal Grandmother    Heart disease Maternal Grandfather    Esophageal cancer Neg Hx    Stomach cancer Neg Hx    Colon polyps Neg Hx    Rectal cancer Neg Hx     Social History   Socioeconomic History   Marital status: Married    Spouse name: Not on file   Number of children: 2   Years of  education: Not on file   Highest education level: Not on file  Occupational History   Occupation: Teacher, adult education: RETIRED    Comment: Retired  Tobacco Use   Smoking status: Never  Smokeless tobacco: Never  Vaping Use   Vaping status: Never Used  Substance and Sexual Activity   Alcohol use: Yes    Alcohol/week: 1.0 standard drink of alcohol    Types: 1 Glasses of wine per week    Comment: 3/week   Drug use: No   Sexual activity: Yes  Other Topics Concern   Not on file  Social History Narrative   Not on file   Social Determinants of Health   Financial Resource Strain: Not on file  Food Insecurity: Not on file  Transportation Needs: Not on file  Physical Activity: Not on file  Stress: Not on file  Social Connections: Not on file  Intimate Partner Violence: Not on file    Review of Systems:  All other review of systems negative except as mentioned in the HPI.  Physical Exam: Vital signs in last 24 hours: BP (!) 143/97   Pulse 75   Temp (!) 97.4 F (36.3 C) (Temporal)   Ht 5\' 4"  (1.626 m)   Wt 178 lb (80.7 kg)   SpO2 100%   BMI 30.55 kg/m  General:   Alert, NAD Lungs:  Clear .   Heart:  Regular rate and rhythm Abdomen:  Soft, nontender and nondistended. Neuro/Psych:  Alert and cooperative. Normal mood and affect. A and O x 3  Reviewed labs, radiology imaging, old records and pertinent past GI work up  Patient is appropriate for planned procedure(s) and anesthesia in an ambulatory setting   K. Scherry Ran , MD [phone]

## 2022-12-31 NOTE — Progress Notes (Addendum)
Pt reports that she had an echo completed since her PV. Results reviewed. EF 60-65%.

## 2022-12-31 NOTE — Patient Instructions (Signed)
YOU HAD AN ENDOSCOPIC PROCEDURE TODAY AT THE  ENDOSCOPY CENTER:   Refer to the procedure report that was given to you for any specific questions about what was found during the examination.  If the procedure report does not answer your questions, please call your gastroenterologist to clarify.  If you requested that your care partner not be given the details of your procedure findings, then the procedure report has been included in a sealed envelope for you to review at your convenience later.  YOU SHOULD EXPECT: Some feelings of bloating in the abdomen. Passage of more gas than usual.  Walking can help get rid of the air that was put into your GI tract during the procedure and reduce the bloating. If you had a lower endoscopy (such as a colonoscopy or flexible sigmoidoscopy) you may notice spotting of blood in your stool or on the toilet paper. If you underwent a bowel prep for your procedure, you may not have a normal bowel movement for a few days.  Please Note:  You might notice some irritation and congestion in your nose or some drainage.  This is from the oxygen used during your procedure.  There is no need for concern and it should clear up in a day or so.  SYMPTOMS TO REPORT IMMEDIATELY:  Following lower endoscopy (colonoscopy or flexible sigmoidoscopy):  Excessive amounts of blood in the stool  Significant tenderness or worsening of abdominal pains  Swelling of the abdomen that is new, acute  Fever of 100F or higher   For urgent or emergent issues, a gastroenterologist can be reached at any hour by calling [phone]. Do not use MyChart messaging for urgent concerns.    DIET:  We do recommend a small meal at first, but then you may proceed to your regular diet.  Drink plenty of fluids but you should avoid alcoholic beverages for 24 hours.  MEDICATIONS: Continue present medications.  Please see handouts given to you by  your recovery nurse: Diverticulosis,  Hemorrhoids.  FOLLOW UP: No repeat surveillance colonoscopy due to age.  Thank you for allowing [place] to provide for your healthcare needs today.   ACTIVITY:  You should plan to take it easy for the rest of today and you should NOT DRIVE or use heavy machinery until tomorrow (because of the sedation medicines used during the test).    FOLLOW UP: Our staff will call the number listed on your records the next business day following your procedure.  We will call around 7:15- 8:00 am to check on you and address any questions or concerns that you may have regarding the information given to you following your procedure. If we do not reach you, we will leave a message.     If any biopsies were taken you will be contacted by phone or by letter within the next 1-3 weeks.  Please call us at [phone] if you have not heard about the biopsies in 3 weeks.    SIGNATURES/CONFIDENTIALITY: You and/or your care partner have signed paperwork which will be entered into your electronic medical record.  These signatures attest to the fact that that the information above on your After Visit Summary has been reviewed and is understood.  Full responsibility of the confidentiality of this discharge information lies with you and/or your care-partner.

## 2022-12-31 NOTE — Op Note (Signed)
[facility] Patient Name: Vanessa Skinner Procedure Date: 12/31/2022 10:39 AM MRN: [id number] Endoscopist: Napoleon Form , MD, [id number] Age: 77 Referring MD:  Date of Birth: 02/13/1946 Gender: Female Account #: [id number] Procedure:                Colonoscopy Indications:              Screening for colorectal malignant neoplasm. H/o                            diverticulitis and iron deficiency anemia, resolved Medicines:                Monitored Anesthesia Care Procedure:                Pre-Anesthesia Assessment:                           - Prior to the procedure, a History and Physical                            was performed, and patient medications and                            allergies were reviewed. The patient's tolerance of                            previous anesthesia was also reviewed. The risks                            and benefits of the procedure and the sedation                            options and risks were discussed with the patient.                            All questions were answered, and informed consent                            was obtained. Prior Anticoagulants: The patient has                            taken no anticoagulant or antiplatelet agents. ASA                            Grade Assessment: II - A patient with mild systemic                            disease. After reviewing the risks and benefits,                            the patient was deemed in satisfactory condition to                            undergo the procedure.  After obtaining informed consent, the colonoscope                            was passed under direct vision. Throughout the                            procedure, the patient's blood pressure, pulse, and                            oxygen saturations were monitored continuously. The                            Olympus Scope [id number] was introduced through the                             anus and advanced to the the cecum, identified by                            appendiceal orifice and ileocecal valve. The                            colonoscopy was performed without difficulty. The                            patient tolerated the procedure well. The quality                            of the bowel preparation was good. The terminal                            ileum, ileocecal valve, appendiceal orifice, and                            rectum were photographed. Scope In: 10:44:15 AM Scope Out: 10:56:09 AM Scope Withdrawal Time: 0 hours 8 minutes 21 seconds  Total Procedure Duration: 0 hours 11 minutes 54 seconds  Findings:                 The perianal and digital rectal examinations were                            normal.                           Multiple large-mouthed, medium-mouthed and                            small-mouthed diverticula were found in the sigmoid                            colon and descending colon. There was narrowing of                            the colon in association with the diverticular  opening. There was evidence of diverticular spasm.                            Peri-diverticular erythema was seen. There was                            evidence of an impacted diverticulum.                           Non-bleeding external and internal hemorrhoids were                            found during retroflexion. The hemorrhoids were                            medium-sized.                           The exam was otherwise without abnormality. Complications:            No immediate complications. Estimated Blood Loss:     Estimated blood loss was minimal. Impression:               - Severe diverticulosis in the sigmoid colon and in                            the descending colon. There was narrowing of the                            colon in association with the diverticular opening.                            There was evidence  of diverticular spasm.                            Peri-diverticular erythema was seen. There was                            evidence of an impacted diverticulum.                           - Non-bleeding external and internal hemorrhoids.                           - The examination was otherwise normal.                           - No specimens collected. Recommendation:           - Patient has a contact number available for                            emergencies. The signs and symptoms of potential                            delayed complications were discussed with the  patient. Return to normal activities tomorrow.                            Written discharge instructions were provided to the                            patient.                           - Resume previous diet.                           - Continue present medications.                           - No repeat colonoscopy due to age. Napoleon Form, MD 12/31/2022 11:03:20 AM This report has been signed electronically.

## 2023-01-01 ENCOUNTER — Telehealth: Payer: Self-pay | Admitting: *Deleted

## 2023-01-01 DIAGNOSIS — E039 Hypothyroidism, unspecified: Secondary | ICD-10-CM | POA: Diagnosis not present

## 2023-01-01 DIAGNOSIS — E559 Vitamin D deficiency, unspecified: Secondary | ICD-10-CM | POA: Diagnosis not present

## 2023-01-01 DIAGNOSIS — I129 Hypertensive chronic kidney disease with stage 1 through stage 4 chronic kidney disease, or unspecified chronic kidney disease: Secondary | ICD-10-CM | POA: Diagnosis not present

## 2023-01-01 DIAGNOSIS — E785 Hyperlipidemia, unspecified: Secondary | ICD-10-CM | POA: Diagnosis not present

## 2023-01-01 DIAGNOSIS — R7301 Impaired fasting glucose: Secondary | ICD-10-CM | POA: Diagnosis not present

## 2023-01-01 NOTE — Telephone Encounter (Signed)
  Follow up Call-     12/31/2022   10:07 AM 06/15/2022    9:17 AM  Call back number  Post procedure Call Back phone  # [phone]  Permission to leave phone message Yes Yes     Patient questions:  Message left to call if necessary.

## 2023-01-07 ENCOUNTER — Telehealth: Payer: Self-pay | Admitting: *Deleted

## 2023-01-07 DIAGNOSIS — I77819 Aortic ectasia, unspecified site: Secondary | ICD-10-CM

## 2023-01-07 NOTE — Telephone Encounter (Signed)
Called pt and reviewed results.  She is ok w having cta chest aorta.  Had labs last week for PCP visit tomorrow.  She will have them send a copy of labs to Dr. Clifton James.  They are also having her wear an ambulatory BP cuff.  She had increased Tekturna to 300 mg from 150, but felt like she was retaining fluid so put herself back to 150 mg daily.  Pt reports Dr. Evlyn Kanner did not start Tekturna, she has been on it for greater than 10 years and when PCPs changed he just took it over.  She wanted to know what Dr. Clifton James wants to do next.  I adv that since the recent changes and BP monitor by Dr. Evlyn Kanner, see how those next steps go.  Then, if she prefers for cardiology to manage BP, Dr. Clifton James will, but it is not advisable for 2 different providers to make changes.  Pt voices understanding and agreement.

## 2023-01-07 NOTE — Telephone Encounter (Signed)
-----   Message from Verne Carrow sent at 12/16/2022 12:14 PM EST ----- Her heart is strong and her valves are ok. Her heart relaxes abnormally which is expected at her age. Her aorta may be slightly enlarged but echo often overestimates this. Can we see if she would be willing to arrange a CTA chest to better assess the size of the aorta? Thanks, chris

## 2023-01-08 DIAGNOSIS — I10 Essential (primary) hypertension: Secondary | ICD-10-CM | POA: Diagnosis not present

## 2023-01-08 DIAGNOSIS — Z0000 Encounter for general adult medical examination without abnormal findings: Secondary | ICD-10-CM | POA: Diagnosis not present

## 2023-01-08 DIAGNOSIS — Z1339 Encounter for screening examination for other mental health and behavioral disorders: Secondary | ICD-10-CM | POA: Diagnosis not present

## 2023-01-08 DIAGNOSIS — Z1331 Encounter for screening for depression: Secondary | ICD-10-CM | POA: Diagnosis not present

## 2023-01-08 DIAGNOSIS — M10072 Idiopathic gout, left ankle and foot: Secondary | ICD-10-CM | POA: Diagnosis not present

## 2023-01-08 DIAGNOSIS — I700 Atherosclerosis of aorta: Secondary | ICD-10-CM | POA: Diagnosis not present

## 2023-01-17 DIAGNOSIS — I10 Essential (primary) hypertension: Secondary | ICD-10-CM | POA: Diagnosis not present

## 2023-01-18 DIAGNOSIS — I10 Essential (primary) hypertension: Secondary | ICD-10-CM | POA: Diagnosis not present

## 2023-02-01 DIAGNOSIS — M10072 Idiopathic gout, left ankle and foot: Secondary | ICD-10-CM | POA: Diagnosis not present

## 2023-02-01 DIAGNOSIS — I129 Hypertensive chronic kidney disease with stage 1 through stage 4 chronic kidney disease, or unspecified chronic kidney disease: Secondary | ICD-10-CM | POA: Diagnosis not present

## 2023-02-01 DIAGNOSIS — N1831 Chronic kidney disease, stage 3a: Secondary | ICD-10-CM | POA: Diagnosis not present

## 2023-02-19 ENCOUNTER — Ambulatory Visit (HOSPITAL_COMMUNITY)
Admission: RE | Admit: 2023-02-19 | Discharge: 2023-02-19 | Disposition: A | Discharge status: Home / Self Care | Payer: Medicare Other | Source: Ambulatory Visit | Attending: Cardiovascular Disease | Admitting: Cardiovascular Disease

## 2023-02-19 DIAGNOSIS — I700 Atherosclerosis of aorta: Secondary | ICD-10-CM | POA: Diagnosis not present

## 2023-02-19 DIAGNOSIS — I77819 Aortic ectasia, unspecified site: Secondary | ICD-10-CM | POA: Diagnosis not present

## 2023-02-19 DIAGNOSIS — K449 Diaphragmatic hernia without obstruction or gangrene: Secondary | ICD-10-CM | POA: Diagnosis not present

## 2023-02-19 DIAGNOSIS — I517 Cardiomegaly: Secondary | ICD-10-CM | POA: Diagnosis not present

## 2023-02-19 DIAGNOSIS — I7121 Aneurysm of the ascending aorta, without rupture: Secondary | ICD-10-CM | POA: Diagnosis not present

## 2023-02-19 MED ORDER — IOHEXOL 350 MG/ML SOLN
100.0000 mL | Freq: Once | INTRAVENOUS | Status: AC | PRN
  Administered 2023-02-19: 100 mL via INTRAVENOUS

## 2023-03-01 ENCOUNTER — Other Ambulatory Visit: Payer: Self-pay | Admitting: *Deleted

## 2023-04-11 DIAGNOSIS — K080 Exfoliation of teeth due to systemic causes: Secondary | ICD-10-CM | POA: Diagnosis not present

## 2023-04-17 DIAGNOSIS — E039 Hypothyroidism, unspecified: Secondary | ICD-10-CM | POA: Diagnosis not present

## 2023-04-17 DIAGNOSIS — R7301 Impaired fasting glucose: Secondary | ICD-10-CM | POA: Diagnosis not present

## 2023-04-22 DIAGNOSIS — H9113 Presbycusis, bilateral: Secondary | ICD-10-CM | POA: Diagnosis not present

## 2023-04-22 DIAGNOSIS — H903 Sensorineural hearing loss, bilateral: Secondary | ICD-10-CM | POA: Diagnosis not present

## 2023-04-22 DIAGNOSIS — H9313 Tinnitus, bilateral: Secondary | ICD-10-CM | POA: Diagnosis not present

## 2023-05-09 DIAGNOSIS — N200 Calculus of kidney: Secondary | ICD-10-CM | POA: Diagnosis not present

## 2023-05-13 DIAGNOSIS — K5730 Diverticulosis of large intestine without perforation or abscess without bleeding: Secondary | ICD-10-CM | POA: Diagnosis not present

## 2023-05-13 DIAGNOSIS — N200 Calculus of kidney: Secondary | ICD-10-CM | POA: Diagnosis not present

## 2023-05-16 DIAGNOSIS — M1711 Unilateral primary osteoarthritis, right knee: Secondary | ICD-10-CM | POA: Diagnosis not present

## 2023-05-18 ENCOUNTER — Other Ambulatory Visit: Payer: Self-pay | Admitting: Gastroenterology

## 2023-05-23 DIAGNOSIS — M170 Bilateral primary osteoarthritis of knee: Secondary | ICD-10-CM | POA: Diagnosis not present

## 2023-05-30 DIAGNOSIS — M1711 Unilateral primary osteoarthritis, right knee: Secondary | ICD-10-CM | POA: Diagnosis not present

## 2023-07-03 DIAGNOSIS — M10072 Idiopathic gout, left ankle and foot: Secondary | ICD-10-CM | POA: Diagnosis not present

## 2023-07-03 DIAGNOSIS — R7301 Impaired fasting glucose: Secondary | ICD-10-CM | POA: Diagnosis not present

## 2023-07-03 DIAGNOSIS — M1A0710 Idiopathic chronic gout, right ankle and foot, without tophus (tophi): Secondary | ICD-10-CM | POA: Diagnosis not present

## 2023-07-03 DIAGNOSIS — I129 Hypertensive chronic kidney disease with stage 1 through stage 4 chronic kidney disease, or unspecified chronic kidney disease: Secondary | ICD-10-CM | POA: Diagnosis not present

## 2023-07-04 DIAGNOSIS — M1712 Unilateral primary osteoarthritis, left knee: Secondary | ICD-10-CM | POA: Diagnosis not present

## 2023-07-11 DIAGNOSIS — M1712 Unilateral primary osteoarthritis, left knee: Secondary | ICD-10-CM | POA: Diagnosis not present

## 2023-07-18 DIAGNOSIS — M1712 Unilateral primary osteoarthritis, left knee: Secondary | ICD-10-CM | POA: Diagnosis not present

## 2023-07-25 DIAGNOSIS — Z85828 Personal history of other malignant neoplasm of skin: Secondary | ICD-10-CM | POA: Diagnosis not present

## 2023-07-25 DIAGNOSIS — L905 Scar conditions and fibrosis of skin: Secondary | ICD-10-CM | POA: Diagnosis not present

## 2023-07-25 DIAGNOSIS — L821 Other seborrheic keratosis: Secondary | ICD-10-CM | POA: Diagnosis not present

## 2023-07-25 DIAGNOSIS — L814 Other melanin hyperpigmentation: Secondary | ICD-10-CM | POA: Diagnosis not present

## 2023-07-25 DIAGNOSIS — L820 Inflamed seborrheic keratosis: Secondary | ICD-10-CM | POA: Diagnosis not present

## 2023-07-25 DIAGNOSIS — L570 Actinic keratosis: Secondary | ICD-10-CM | POA: Diagnosis not present

## 2023-08-20 DIAGNOSIS — M1711 Unilateral primary osteoarthritis, right knee: Secondary | ICD-10-CM | POA: Diagnosis not present

## 2023-08-26 DIAGNOSIS — M1711 Unilateral primary osteoarthritis, right knee: Secondary | ICD-10-CM | POA: Diagnosis not present

## 2023-09-11 DIAGNOSIS — M1711 Unilateral primary osteoarthritis, right knee: Secondary | ICD-10-CM | POA: Diagnosis not present

## 2023-10-10 DIAGNOSIS — M1711 Unilateral primary osteoarthritis, right knee: Secondary | ICD-10-CM | POA: Diagnosis not present

## 2023-10-11 ENCOUNTER — Telehealth: Payer: Self-pay

## 2023-10-11 NOTE — Telephone Encounter (Signed)
   Pre-operative Risk Assessment    Patient Name: Vanessa Skinner  DOB: 07-06-1945 MRN: [id number]   Date of last office visit: 11/26/22 LONNI CASH, MD Date of next office visit: 11/27/23 LONNI CASH, MD   Request for Surgical Clearance    Procedure:  RIGHT TOTAL KNEE ARTHROPLASTY  Date of Surgery:  Clearance 12/30/23                                Surgeon:  DR DEMPSEY MOAN Surgeon's Group or Practice Name:  [facility] Phone number:  [phone] Fax number:  [phone]  ATTN: KERRI MAZE   Type of Clearance Requested:   - Medical  - Pharmacy:  Hold Aspirin     Type of Anesthesia:  CHOICE   Additional requests/questions:    SignedLucie DELENA Ku   10/11/2023, 3:57 PM

## 2023-10-11 NOTE — Telephone Encounter (Signed)
 Patient is already scheduled for 11/27/2023 with Dr. Verlin and asked if she can keep that appointment I will add Preop to appointment notes.

## 2023-10-11 NOTE — Telephone Encounter (Signed)
   Name: Vanessa Skinner  DOB: 12-19-1945  MRN: [id number]  Primary Cardiologist: Lonni Cash, MD   Preoperative team, please contact this patient and set up a phone call appointment for further preoperative risk assessment. Please obtain consent and complete medication review. Thank you for your help.  I confirm that guidance regarding antiplatelet and oral anticoagulation therapy has been completed and, if necessary, noted below.  Per office protocol, if patient is without any new symptoms or concerns at the time of their virtual visit, she may hold aspirin for 7 days prior to procedure. Please resume aspirin as soon as possible postprocedure, at the discretion of the surgeon.    I also confirmed the patient resides in the state of [place] . As per [facility] Medical Board telemedicine laws, the patient must reside in the state in which the provider is licensed.   Lamarr Satterfield, NP 10/11/2023, 4:07 PM [facility]

## 2023-10-20 ENCOUNTER — Other Ambulatory Visit: Payer: Self-pay | Admitting: Gastroenterology

## 2023-10-23 DIAGNOSIS — H40013 Open angle with borderline findings, low risk, bilateral: Secondary | ICD-10-CM | POA: Diagnosis not present

## 2023-10-23 DIAGNOSIS — Z9889 Other specified postprocedural states: Secondary | ICD-10-CM | POA: Diagnosis not present

## 2023-10-23 DIAGNOSIS — Z961 Presence of intraocular lens: Secondary | ICD-10-CM | POA: Diagnosis not present

## 2023-10-23 DIAGNOSIS — H04123 Dry eye syndrome of bilateral lacrimal glands: Secondary | ICD-10-CM | POA: Diagnosis not present

## 2023-11-11 DIAGNOSIS — L820 Inflamed seborrheic keratosis: Secondary | ICD-10-CM | POA: Diagnosis not present

## 2023-11-12 ENCOUNTER — Other Ambulatory Visit: Payer: Self-pay | Admitting: Endocrinology

## 2023-11-12 DIAGNOSIS — Z1231 Encounter for screening mammogram for malignant neoplasm of breast: Secondary | ICD-10-CM

## 2023-11-13 ENCOUNTER — Telehealth: Payer: Self-pay | Admitting: Cardiovascular Disease

## 2023-11-13 DIAGNOSIS — K080 Exfoliation of teeth due to systemic causes: Secondary | ICD-10-CM | POA: Diagnosis not present

## 2023-11-13 NOTE — Telephone Encounter (Signed)
 Caller Laymon) stated can fax pre-med clearance letter to  [phone].

## 2023-11-13 NOTE — Telephone Encounter (Signed)
 Per preop APP Jon Hails, [facility] she faxed notes to the [facility].

## 2023-11-13 NOTE — Telephone Encounter (Signed)
   Pre-operative Risk Assessment    Patient Name: Vanessa Skinner  DOB: May 11, 1945 MRN: [id number]   Date of last office visit: 11/26/22 Date of next office visit: 11/27/23   Request for Surgical Clearance    Procedure:  Dental cleaning  Date of Surgery:  Clearance 11/13/23                                Surgeon:  Dr. Juliene Essex Surgeon's Group or Practice Name:  [facility] Phone number:  [phone]  Fax number:  [phone]   Type of Clearance Requested:   - Medical    Type of Anesthesia:  None    Additional requests/questions:  Caller Laymon) stated patient is in the chair and they will need to know if patient will need to be pre-medication prior to her cleaning.  Signed, Jasmin B Wilson   11/13/2023, 2:02 PM

## 2023-11-13 NOTE — Telephone Encounter (Signed)
   Patient Name: Vanessa Skinner  DOB: 11-18-1945 MRN: [id number]  Primary Cardiologist: Lonni Cash, MD  Chart reviewed as part of pre-operative protocol coverage.   This patient has not been seen by our practice since 11/2022. In our records, this patient does not have a history of valve replacement or endocarditis requiring SBE PPX. This information may not be up to date given that she has not been seen in 1 year.    I will route this recommendation to the requesting party via Epic fax function and remove from pre-op pool.  Please call with questions.  Jon Garre Amika Tassin, PA 11/13/2023, 2:18 PM

## 2023-11-27 ENCOUNTER — Telehealth: Payer: Self-pay | Admitting: Adult Health

## 2023-11-27 ENCOUNTER — Encounter: Payer: Self-pay | Admitting: Cardiovascular Disease

## 2023-11-27 ENCOUNTER — Ambulatory Visit: Attending: Cardiovascular Disease | Admitting: Cardiovascular Disease

## 2023-11-27 VITALS — BP 128/72 | HR 75 | Ht 64.0 in | Wt 179.4 lb

## 2023-11-27 DIAGNOSIS — Z01810 Encounter for preprocedural cardiovascular examination: Secondary | ICD-10-CM | POA: Diagnosis not present

## 2023-11-27 DIAGNOSIS — I7121 Aneurysm of the ascending aorta, without rupture: Secondary | ICD-10-CM | POA: Diagnosis not present

## 2023-11-27 DIAGNOSIS — I10 Essential (primary) hypertension: Secondary | ICD-10-CM | POA: Diagnosis not present

## 2023-11-27 NOTE — Progress Notes (Signed)
 Chief Complaint  Patient presents with   Follow-up    Thoracic aortic aneurysm   History of Present Illness: 78 yo female with history of GERD, hyperlipidemia, HTN, hypothyroidism and mild dilation of the ascending aorta who is here today for follow up. I saw her in October 2024 for evaluation of an abnormal EKG. Her EKG showed sinus rhythm with poor R wave progression. Echo November 2024 with normal LV size and systolic function. Grade 1 diastolic dysfunction. No valve disease. Dilation of aortic root. Chest CTA January 2025 with 4.0 cm ascending aorta.   She is here today for follow up. The patient denies any chest pain, dyspnea, palpitations, lower extremity edema, orthopnea, PND, dizziness, near syncope or syncope.   I also take care of her husband Kelis Plasse.   Primary Care Physician: Nichole Senior, MD   Past Medical History:  Diagnosis Date   Allergy    seasonal   Anxiety    occasional   Arthritis    Cataract    Diverticulosis    DJD (degenerative joint disease)    Fatty liver disease, nonalcoholic    Gastritis    GERD (gastroesophageal reflux disease)    Gout    Hearing impaired    Heart murmur    MVP   Hemorrhoids    Hyperlipidemia    Hypertension    Hypothyroidism    Kidney stone 05/03/2022   Nephrolithiasis    Osteopenia    Thyroid  disease     Past Surgical History:  Procedure Laterality Date   arm surgery Left    COLONOSCOPY     DILATION AND CURETTAGE OF UTERUS  1975   HAND SURGERY Right 1998   KNEE ARTHROSCOPY Right 1995   KNEE ARTHROSCOPY Right    OVARIAN CYST SURGERY  1968   TONSILLECTOMY     TOTAL ABDOMINAL HYSTERECTOMY W/ BILATERAL SALPINGOOPHORECTOMY  1987   TUBAL LIGATION     WRIST FRACTURE SURGERY Left 2001    Current Outpatient Medications  Medication Sig Dispense Refill   acetaminophen (TYLENOL 8 HOUR ARTHRITIS PAIN) 650 MG CR tablet Take 650 mg by mouth as needed.     aliskiren (TEKTURNA) 150 MG tablet Take 300 mg by mouth  daily.  Per patient 12/19/22 - recommended by Dr. Nichole for elevated BP readings     allopurinol (ZYLOPRIM) 100 MG tablet Take 100 mg by mouth daily.     ALPRAZolam (XANAX) 0.5 MG tablet Take 0.5 mg by mouth at bedtime as needed.     amLODipine (NORVASC) 5 MG tablet Take 5 mg by mouth daily.     aspirin EC 81 MG tablet Take 81 mg by mouth daily.     carboxymethylcellulose (REFRESH PLUS) 0.5 % SOLN Place 1 drop into both eyes daily as needed.     Cholecalciferol (REPLESTA) 1.25 MG (50000 UT) WAFR Take 1 Wafer by mouth once a week.     colchicine 0.6 MG tablet Take 0.6 mg by mouth as needed.     dicyclomine  (BENTYL ) 10 MG capsule TAKE 1 CAPSULE BY MOUTH EVERY MORNING AND TAKE 1 CAPSULE BY MOUTH EVERY NIGHT AT BEDTIME 60 capsule 0   ezetimibe (ZETIA) 10 MG tablet Take 10 mg by mouth daily.     famotidine  (PEPCID ) 20 MG tablet TAKE 1 TABLET BY MOUTH 2 TIMES A DAY 180 tablet 3   furosemide (LASIX) 20 MG tablet Take 20 mg by mouth daily. swelling     levothyroxine (SYNTHROID) 88 MCG tablet Take  88 mcg by mouth daily before breakfast.     methocarbamol (ROBAXIN) 500 MG tablet Take 500 mg by mouth as needed.     pravastatin (PRAVACHOL) 40 MG tablet Take 40 mg by mouth daily.       sodium chloride  (MURO 128) 5 % ophthalmic ointment Place 1 Application into both eyes at bedtime as needed.     Current Facility-Administered Medications  Medication Dose Route Frequency Provider Last Rate Last Admin   0.9 %  sodium chloride  infusion  500 mL Intravenous Once Nandigam, Kavitha V, MD        Allergies  Allergen Reactions   Epinephrine      REACTION: LARGE AMOUNT/heart races   Kenalog  [Triamcinolone  Acetonide]    Latex    Morphine     REACTION: rash/itching   Propoxyphene Hcl     REACTION: nausea   Sulfonamide Derivatives     REACTION: Rash/itching    Social History   Socioeconomic History   Marital status: Married    Spouse name: Not on file   Number of children: 2   Years of education: Not on  file   Highest education level: Not on file  Occupational History   Occupation: Teacher, adult education: RETIRED    Comment: Retired  Tobacco Use   Smoking status: Never   Smokeless tobacco: Never  Vaping Use   Vaping status: Never Used  Substance and Sexual Activity   Alcohol  use: Yes    Alcohol /week: 1.0 standard drink of alcohol     Types: 1 Glasses of wine per week    Comment: 3/week   Drug use: No   Sexual activity: Yes  Other Topics Concern   Not on file  Social History Narrative   Not on file   Social Drivers of Health   Financial Resource Strain: Not on file  Food Insecurity: Low Risk  (05/09/2023)   Received from [facility]   Hunger Vital Sign    Within the past 12 months, you worried that your food would run out before you got money to buy more: Never true    Within the past 12 months, the food you bought just didn't last and you didn't have money to get more. : Never true  Transportation Needs: No Transportation Needs (05/09/2023)   Received from [facility]    In the past 12 months, has lack of reliable transportation kept you from medical appointments, meetings, work or from getting things needed for daily living? : No  Physical Activity: Not on file  Stress: Not on file  Social Connections: Not on file  Intimate Partner Violence: Not on file    Family History  Problem Relation Age of Onset   Heart disease Mother 38       heart attack   Diabetes Father    Heart disease Father 46       MI   Kidney disease Father    Breast cancer Maternal Aunt    Colon cancer Maternal Grandmother    Heart disease Maternal Grandfather    Esophageal cancer Neg Hx    Stomach cancer Neg Hx    Colon polyps Neg Hx    Rectal cancer Neg Hx     Review of Systems:  As stated in the HPI and otherwise negative.   BP 128/72   Pulse 75   Ht 5' 4 (1.626 m)   Wt 179 lb 6.4 oz (81.4 kg)   SpO2 97%  BMI 30.79 kg/m   Physical Examination: General: Well  developed, well nourished, NAD  HEENT: OP clear, mucus membranes moist  SKIN: warm, dry. No rashes. Neuro: No focal deficits  Musculoskeletal: Muscle strength 5/5 all ext  Psychiatric: Mood and affect normal  Neck: No JVD, no carotid bruits, no thyromegaly, no lymphadenopathy.  Lungs:Clear bilaterally, no wheezes, rhonci, crackles Cardiovascular: Regular rate and rhythm. No murmurs, gallops or rubs. Abdomen:Soft. Bowel sounds present. Non-tender.  Extremities: No lower extremity edema. Pulses are 2 + in the bilateral DP/PT.  EKG:  EKG is ordered today. The ekg ordered today demonstrates  EKG Interpretation Date/Time:  Wednesday November 27 2023 10:34:26 EDT Ventricular Rate:  75 PR Interval:  160 QRS Duration:  84 QT Interval:  404 QTC Calculation: 451 R Axis:   19  Text Interpretation: Normal sinus rhythm Low voltage QRS Poor R wave progression Confirmed by Verlin Bruckner [phone]) on 11/27/2023 10:46:37 AM    Recent Labs: No results found for requested labs within last 365 days.   Lipid Panel No results found for: CHOL, TRIG, HDL, CHOLHDL, VLDL, LDLCALC, LDLDIRECT   Wt Readings from Last 3 Encounters:  11/27/23 179 lb 6.4 oz (81.4 kg)  12/31/22 178 lb (80.7 kg)  12/10/22 178 lb (80.7 kg)    Assessment and Plan:   1. Thoracic aortic aneurysm: 4.0 cm by chest CTA January 2025. Repeat chest CTA in January 2026.   2. HTN: BP is well controlled. Continue Tekturna and Norvasc.   3. Pre-operative cardiovascular examination: She has no active cardiac issues. EKG is unchanged. She feels well. Cardiac exam is normal. She can proceed with her planned surgical procedure.   Labs/ tests ordered today include:   Orders Placed This Encounter  Procedures   CT ANGIO CHEST AORTA W/CM & OR WO/CM   EKG 12-Lead   Disposition:   F/U with me in one year   Signed, Bruckner Verlin, MD, [facility] 11/27/2023 10:55 AM    [facility] [address] [place], [place], [place]  [address] Phone: [phone]; Fax: [phone]

## 2023-11-27 NOTE — Telephone Encounter (Signed)
   Patient Name: Vanessa Skinner  DOB: [birth year] MRN: [id number]  Primary Cardiologist: Lonni Cash, MD  Chart reviewed as part of pre-operative protocol coverage. Given past medical history and time since last visit, based on ACC/AHA guidelines, Vanessa Skinner is at acceptable risk for the planned procedure without further cardiovascular testing.   Per Dr. Cash 11/27/2023 Pre-operative cardiovascular examination: She has no active cardiac issues. EKG is unchanged. She feels well. Cardiac exam is normal. She can proceed with her planned surgical procedure.  Per office protocol, if patient is without any new symptoms or concerns at the time of their virtual visit, she may hold aspirin for 7 days prior to procedure. Please resume aspirin as soon as possible postprocedure, at the discretion of the surgeon.    The patient was advised that if she develops new symptoms prior to surgery to contact our office to arrange for a follow-up visit, and she verbalized understanding.  I will route this recommendation to the requesting party via Epic fax function and remove from pre-op pool.  Please call with questions.  Lamarr Satterfield, NP 11/27/2023, 1:47 PM

## 2023-11-27 NOTE — Patient Instructions (Signed)
 Medication Instructions:  Your physician recommends that you continue on your current medications as directed. Please refer to the Current Medication list given to you today.  *If you need a refill on your cardiac medications before your next appointment, please call your pharmacy*  Lab Work: none If you have labs (blood work) drawn today and your tests are completely normal, you will receive your results only by: MyChart Message (if you have MyChart) OR A paper copy in the mail If you have any lab test that is abnormal or we need to change your treatment, we will call you to review the results.  Testing/Procedures: Non-Cardiac CT Angiography (CTA), is a special type of CT scan that uses a computer to produce multi-dimensional views of major blood vessels throughout the body. In CT angiography, a contrast material is injected through an IV to help visualize the blood vessels --To be done in January 2026  Follow-Up: At [facility], you and your health needs are our priority.  As part of our continuing mission to provide you with exceptional heart care, our providers are all part of one team.  This team includes your primary Cardiologist (physician) and Advanced Practice Providers or APPs (Physician Assistants and Nurse Practitioners) who all work together to provide you with the care you need, when you need it.  Your next appointment:   12 month(s)  Provider:   Lonni Cash, MD    We recommend signing up for the patient portal called MyChart.  Sign up information is provided on this After Visit Summary.  MyChart is used to connect with patients for Virtual Visits (Telemedicine).  Patients are able to view lab/test results, encounter notes, upcoming appointments, etc.  Non-urgent messages can be sent to your provider as well.   To learn more about what you can do with MyChart, go to [email or web address].   Other Instructions

## 2023-12-02 DIAGNOSIS — G8929 Other chronic pain: Secondary | ICD-10-CM | POA: Diagnosis not present

## 2023-12-02 DIAGNOSIS — M25561 Pain in right knee: Secondary | ICD-10-CM | POA: Diagnosis not present

## 2023-12-02 DIAGNOSIS — M1711 Unilateral primary osteoarthritis, right knee: Secondary | ICD-10-CM | POA: Diagnosis not present

## 2023-12-02 DIAGNOSIS — M25661 Stiffness of right knee, not elsewhere classified: Secondary | ICD-10-CM | POA: Diagnosis not present

## 2023-12-02 NOTE — H&P (Signed)
 TOTAL KNEE ADMISSION H&P  Patient is being admitted for right total knee arthroplasty.  Subjective:  Chief Complaint: Right knee pain.  HPI: Vanessa Skinner, 78 y.o. female has a history of pain and functional disability in the right knee due to arthritis and has failed non-surgical conservative treatments for greater than 12 weeks to include NSAID's and/or analgesics, corticosteriod injections, viscosupplementation injections, and activity modification. Onset of symptoms was gradual, starting several years ago with gradually worsening course since that time. The patient noted no past surgery on the right knee.  Patient currently rates pain in the right knee at 7 out of 10 with activity. Patient has worsening of pain with activity and weight bearing and pain that interferes with activities of daily living. Patient has evidence of periarticular osteophytes and joint space narrowing by imaging studies. There is no active infection.  Patient Active Problem List   Diagnosis Date Noted   HYPOTHYROIDISM 09/26/2009   ANXIETY 09/26/2009   HYPERLIPIDEMIA 09/20/2009   HYPERTENSION 09/20/2009   HEMORRHOIDS 09/20/2009   DIVERTICULOSIS, COLON 09/20/2009   FATTY LIVER DISEASE 09/20/2009   DEGENERATIVE JOINT DISEASE 09/20/2009   ABDOMINAL PAIN, UNSPECIFIED SITE 09/20/2009   GASTRITIS, HX OF 09/20/2009   NEPHROLITHIASIS, HX OF 09/20/2009    Past Medical History:  Diagnosis Date   Allergy    seasonal   Anxiety    occasional   Arthritis    Cataract    Diverticulosis    DJD (degenerative joint disease)    Fatty liver disease, nonalcoholic    Gastritis    GERD (gastroesophageal reflux disease)    Gout    Hearing impaired    Heart murmur    MVP   Hemorrhoids    Hyperlipidemia    Hypertension    Hypothyroidism    Kidney stone 05/03/2022   Nephrolithiasis    Osteopenia    Thyroid  disease     Past Surgical History:  Procedure Laterality Date   arm surgery Left    COLONOSCOPY      DILATION AND CURETTAGE OF UTERUS  1975   HAND SURGERY Right 1998   KNEE ARTHROSCOPY Right 1995   KNEE ARTHROSCOPY Right    OVARIAN CYST SURGERY  1968   TONSILLECTOMY     TOTAL ABDOMINAL HYSTERECTOMY W/ BILATERAL SALPINGOOPHORECTOMY  1987   TUBAL LIGATION     WRIST FRACTURE SURGERY Left 2001    Prior to Admission medications   Medication Sig Start Date End Date Taking? Authorizing Provider  acetaminophen (TYLENOL 8 HOUR ARTHRITIS PAIN) 650 MG CR tablet Take 650 mg by mouth as needed.    [provider]  aliskiren (TEKTURNA) 150 MG tablet Take 300 mg by mouth daily.  Per patient 12/19/22 - recommended by Dr. Nichole for elevated BP readings    [provider]  allopurinol (ZYLOPRIM) 100 MG tablet Take 100 mg by mouth daily. 02/08/23   [provider]  ALPRAZolam (XANAX) 0.5 MG tablet Take 0.5 mg by mouth at bedtime as needed.    [provider]  amLODipine (NORVASC) 5 MG tablet Take 5 mg by mouth daily. 01/18/23   [provider]  aspirin EC 81 MG tablet Take 81 mg by mouth daily.    [provider]  carboxymethylcellulose (REFRESH PLUS) 0.5 % SOLN Place 1 drop into both eyes daily as needed.    [provider]  Cholecalciferol (REPLESTA) 1.25 MG (50000 UT) WAFR Take 1 Wafer by mouth once a week. 09/26/20   [provider]  colchicine 0.6 MG tablet Take 0.6 mg by mouth as needed.    [provider]  dicyclomine  (BENTYL ) 10 MG capsule TAKE 1 CAPSULE BY MOUTH EVERY MORNING AND TAKE 1 CAPSULE BY MOUTH EVERY NIGHT AT BEDTIME 05/20/23   Nandigam, Kavitha V, MD  ezetimibe (ZETIA) 10 MG tablet Take 10 mg by mouth daily.    [provider]  famotidine  (PEPCID ) 20 MG tablet TAKE 1 TABLET BY MOUTH 2 TIMES A DAY 10/21/23   Nandigam, Kavitha V, MD  furosemide (LASIX) 20 MG tablet Take 20 mg by mouth daily. swelling    [provider]  levothyroxine (SYNTHROID) 88 MCG tablet Take 88 mcg by mouth daily before  breakfast.    [provider]  methocarbamol (ROBAXIN) 500 MG tablet Take 500 mg by mouth as needed.    [provider]  pravastatin (PRAVACHOL) 40 MG tablet Take 40 mg by mouth daily.      [provider]  sodium chloride  (MURO 128) 5 % ophthalmic ointment Place 1 Application into both eyes at bedtime as needed. 02/27/18   [provider]    Allergies  Allergen Reactions   Epinephrine      REACTION: LARGE AMOUNT/heart races   Kenalog  [Triamcinolone  Acetonide]    Latex    Morphine     REACTION: rash/itching   Propoxyphene Hcl     REACTION: nausea   Sulfonamide Derivatives     REACTION: Rash/itching    Social History   Socioeconomic History   Marital status: Married    Spouse name: Not on file   Number of children: 2   Years of education: Not on file   Highest education level: Not on file  Occupational History   Occupation: Teacher, adult education: RETIRED    Comment: Retired  Tobacco Use   Smoking status: Never   Smokeless tobacco: Never  Vaping Use   Vaping status: Never Used  Substance and Sexual Activity   Alcohol  use: Yes    Alcohol /week: 1.0 standard drink of alcohol     Types: 1 Glasses of wine per week    Comment: 3/week   Drug use: No   Sexual activity: Yes  Other Topics Concern   Not on file  Social History Narrative   Not on file   Social Drivers of Health   Financial Resource Strain: Not on file  Food Insecurity: Low Risk  (05/09/2023)   Received from [facility]   Hunger Vital Sign    Within the past 12 months, you worried that your food would run out before you got money to buy more: Never true    Within the past 12 months, the food you bought just didn't last and you didn't have money to get more. : Never true  Transportation Needs: No Transportation Needs (05/09/2023)   Received from [facility]    In the past 12 months, has lack of reliable transportation kept you from medical appointments,  meetings, work or from getting things needed for daily living? : No  Physical Activity: Not on file  Stress: Not on file  Social Connections: Not on file  Intimate Partner Violence: Not on file    Tobacco Use: Low Risk  (11/27/2023)   Patient History    Smoking Tobacco Use: Never    Smokeless Tobacco Use: Never    Passive Exposure: Not on file   Social History   Substance and Sexual Activity  Alcohol  Use Yes   Alcohol /week:  1.0 standard drink of alcohol    Types: 1 Glasses of wine per week   Comment: 3/week    Family History  Problem Relation Age of Onset   Heart disease Mother 30       heart attack   Diabetes Father    Heart disease Father 36       MI   Kidney disease Father    Breast cancer Maternal Aunt    Colon cancer Maternal Grandmother    Heart disease Maternal Grandfather    Esophageal cancer Neg Hx    Stomach cancer Neg Hx    Colon polyps Neg Hx    Rectal cancer Neg Hx     ROS  Objective:  Physical Exam: Constitutional: Well-developed female, alert and oriented, no apparent distress. Musculoskeletal: Right hip: Normal range of motion without discomfort. Right knee: No effusion. Range of motion approximately 15-110 degrees with crepitus noted during movement. Tenderness medial and lateral. No instability observed. Gait: Significant antalgic pattern on the right.  IMAGING: Radiographs demonstrate bone-on-bone patellofemoral arthritis and near bone-on-bone lateral arthritis with marginal osteophyte formation.  Assessment/Plan:  End stage arthritis, right knee   The patient history, physical examination, clinical judgment of the provider and imaging studies are consistent with end stage degenerative joint disease of the right knee and total knee arthroplasty is deemed medically necessary. The treatment options including medical management, injection therapy arthroscopy and arthroplasty were discussed at length. The risks and benefits of total knee  arthroplasty were presented and reviewed. The risks due to aseptic loosening, infection, stiffness, patella tracking problems, thromboembolic complications and other imponderables were discussed. The patient acknowledged the explanation, agreed to proceed with the plan and consent was signed. Patient is being admitted for inpatient treatment for surgery, pain control, PT, OT, prophylactic antibiotics, VTE prophylaxis, progressive ambulation and ADLs and discharge planning. The patient is planning to be discharged home.   Patient's anticipated LOS is less than 2 midnights, meeting these requirements: - Younger than 29 - Lives within 1 hour of care - Has a competent adult at home to recover with post-op recover - NO history of  - Chronic pain requiring opiods  - Diabetes  - Coronary Artery Disease  - Heart failure  - Heart attack  - Stroke  - DVT/VTE  - Cardiac arrhythmia  - Respiratory Failure/COPD  - Renal failure  - Anemia  - Advanced Liver disease   Therapy Plans: EO [place] Disposition: Home with Husband  Planned DVT Prophylaxis: Xarelto 10mg  QD  DME Needed: CTU PCP: Garnette Ore, MD (clearance received) Cardiology: Medford Ditch, MD (clearance received)  TXA: IV  Allergies: Azithromycin (N/V), Epinephrine  (tachycardia), Hydrochlorothiazide, Morphine (rash/itching)**, NSAIDs (N/V, Hx of GI Bleed)**, sulfa (rash), latex (rash), propoxyphene (N/V) Metal Allergies: none  Anesthesia Concerns: Nausea  BMI: 32.2 Last HgbA1c: not diabetic  Pharmacy: [facility] to bring on discharge  Pain regimen: Oxycodone (has done well in the past), Tramadol, Tylenol, IV Dilaudid for breakthrough (has done well with in the past)  Other: - aspirin hold 7 days prior to sx per cardio  - Hx of GI bleed - Needs CTU   - Patient was instructed on what medications to stop prior to surgery. - Follow-up visit in 2 weeks with Dr. Melodi - Begin physical therapy following surgery -  Pre-operative lab work as pre-surgical testing - Prescriptions will be provided in hospital at time of discharge  Corean Sender, PA-C Orthopedic Surgery [facility] [place]

## 2023-12-04 ENCOUNTER — Ambulatory Visit
Admission: RE | Admit: 2023-12-04 | Discharge: 2023-12-04 | Disposition: A | Source: Ambulatory Visit | Attending: Endocrinology | Admitting: Endocrinology

## 2023-12-04 DIAGNOSIS — Z1231 Encounter for screening mammogram for malignant neoplasm of breast: Secondary | ICD-10-CM

## 2023-12-09 ENCOUNTER — Other Ambulatory Visit: Payer: Self-pay | Admitting: Gastroenterology

## 2023-12-10 ENCOUNTER — Telehealth: Payer: Self-pay

## 2023-12-10 ENCOUNTER — Other Ambulatory Visit (HOSPITAL_COMMUNITY): Payer: Self-pay

## 2023-12-10 NOTE — Telephone Encounter (Signed)
 Pharmacy Patient Advocate Encounter   Received notification from CoverMyMeds that prior authorization for Dicyclomine  10MG  capsule is required/requested.   Insurance verification completed.   The patient is insured through [employer].   Per test claim: Patient has not been seen in office in over a year. Insurance requires documentation from within the past 12 months for submission.

## 2023-12-10 NOTE — Telephone Encounter (Signed)
 The pt states that she was able to pick up the prescription yesterday with no issues. She is going to have knee surgery soon and will make appt for follow up after that

## 2023-12-16 NOTE — Patient Instructions (Signed)
 SURGICAL WAITING ROOM VISITATION  Patients having surgery or a procedure may have no more than 2 support people in the waiting area - these visitors may rotate.    Children under the age of 67 must have an adult with them who is not the patient.  Visitors with respiratory illnesses are discouraged from visiting and should remain at home.  If the patient needs to stay at the hospital during part of their recovery, the visitor guidelines for inpatient rooms apply. Pre-op nurse will coordinate an appropriate time for 1 support person to accompany patient in pre-op.  This support person may not rotate.    Please refer to the [facility] website for the visitor guidelines for Inpatients (after your surgery is over and you are in a regular room).       Your procedure is scheduled on:  12/30/2023    Report to [facility] Main Entrance    Report to admitting at  1000 AM   Call this number if you have problems the morning of surgery [phone]   Do not eat food :After Midnight.   After Midnight you may have the following liquids until _ 0930_____ AM DAY OF SURGERY  Water Non-Citrus Juices (without pulp, NO RED-Apple, White grape, White cranberry) Black Coffee (NO MILK/CREAM OR CREAMERS, sugar ok)  Clear Tea (NO MILK/CREAM OR CREAMERS, sugar ok) regular and decaf                             Plain Jell-O (NO RED)                                           Fruit ices (not with fruit pulp, NO RED)                                     Popsicles (NO RED)                                                               Sports drinks like Gatorade (NO RED)                   The day of surgery:  Drink ONE (1) Pre-Surgery Clear Ensure or G2 at  0930AM the morning of surgery. Drink in one sitting. Do not sip.  This drink was given to you during your hospital  pre-op appointment visit. Nothing else to drink after completing the  Pre-Surgery Clear Ensure or G2.          If you have  questions, please contact your surgeon's office.       Oral Hygiene is also important to reduce your risk of infection.                                    Remember - BRUSH YOUR TEETH THE MORNING OF SURGERY WITH YOUR REGULAR TOOTHPASTE  DENTURES WILL BE REMOVED PRIOR TO SURGERY PLEASE DO NOT APPLY Poly grip OR ADHESIVES!!!  Do NOT smoke after Midnight   Stop all vitamins and herbal supplements 7 days before surgery.   Take these medicines the morning of surgery with A SIP OF WATER:  tekturna, allopurinol, amlodipine, pepcid , synthroid   DO NOT TAKE ANY ORAL DIABETIC MEDICATIONS DAY OF YOUR SURGERY  Bring CPAP mask and tubing day of surgery.                              You may not have any metal on your body including hair pins, jewelry, and body piercing             Do not wear make-up, lotions, powders, perfumes/cologne, or deodorant  Do not wear nail polish including gel and S&S, artificial/acrylic nails, or any other type of covering on natural nails including finger and toenails. If you have artificial nails, gel coating, etc. that needs to be removed by a nail salon please have this removed prior to surgery or surgery may need to be canceled/ delayed if the surgeon/ anesthesia feels like they are unable to be safely monitored.   Do not shave  48 hours prior to surgery.               Men may shave face and neck.   Do not bring valuables to the hospital. [facility] IS NOT             RESPONSIBLE   FOR VALUABLES.   Contacts, glasses, dentures or bridgework may not be worn into surgery.   Bring small overnight bag day of surgery.   DO NOT BRING YOUR HOME MEDICATIONS TO THE HOSPITAL. PHARMACY WILL DISPENSE MEDICATIONS LISTED ON YOUR MEDICATION LIST TO YOU DURING YOUR ADMISSION IN THE HOSPITAL!    Patients discharged on the day of surgery will not be allowed to drive home.  Someone NEEDS to stay with you for the first 24 hours after anesthesia.   Special Instructions:  Bring a copy of your healthcare power of attorney and living will documents the day of surgery if you haven't scanned them before.              Please read over the following fact sheets you were given: IF YOU HAVE QUESTIONS ABOUT YOUR PRE-OP INSTRUCTIONS PLEASE CALL [phone].   If you received a COVID test during your pre-op visit  it is requested that you wear a mask when out in public, stay away from anyone that may not be feeling well and notify your surgeon if you develop symptoms. If you test positive for Covid or have been in contact with anyone that has tested positive in the last 10 days please notify you surgeon.      Pre-operative 4 CHG Bath Instructions   You can play a key role in reducing the risk of infection after surgery. Your skin needs to be as free of germs as possible. You can reduce the number of germs on your skin by washing with CHG (chlorhexidine gluconate) soap before surgery. CHG is an antiseptic soap that kills germs and continues to kill germs even after washing.   DO NOT use if you have an allergy to chlorhexidine/CHG or antibacterial soaps. If your skin becomes reddened or irritated, stop using the CHG and notify one of our RNs at [phone].   Please shower with the CHG soap starting 4 days before surgery using the following schedule:     Please keep in mind the  following:  DO NOT shave, including legs and underarms, starting the day of your first shower.   You may shave your face at any point before/day of surgery.  Place clean sheets on your bed the day you start using CHG soap. Use a clean washcloth (not used since being washed) for each shower. DO NOT sleep with pets once you start using the CHG.   CHG Shower Instructions:  If you choose to wash your hair and private area, wash first with your normal shampoo/soap.  After you use shampoo/soap, rinse your hair and body thoroughly to remove shampoo/soap residue.  Turn the water OFF and apply about 3  tablespoons (45 ml) of CHG soap to a CLEAN washcloth.  Apply CHG soap ONLY FROM YOUR NECK DOWN TO YOUR TOES (washing for 3-5 minutes)  DO NOT use CHG soap on face, private areas, open wounds, or sores.  Pay special attention to the area where your surgery is being performed.  If you are having back surgery, having someone wash your back for you may be helpful. Wait 2 minutes after CHG soap is applied, then you may rinse off the CHG soap.  Pat dry with a clean towel  Put on clean clothes/pajamas   If you choose to wear lotion, please use ONLY the CHG-compatible lotions on the back of this paper.     Additional instructions for the day of surgery: DO NOT APPLY any lotions, deodorants, cologne, or perfumes.   Put on clean/comfortable clothes.  Brush your teeth.  Ask your nurse before applying any prescription medications to the skin.      CHG Compatible Lotions   Aveeno Moisturizing lotion  Cetaphil Moisturizing Cream  Cetaphil Moisturizing Lotion  Clairol Herbal Essence Moisturizing Lotion, Dry Skin  Clairol Herbal Essence Moisturizing Lotion, Extra Dry Skin  Clairol Herbal Essence Moisturizing Lotion, Normal Skin  Curel Age Defying Therapeutic Moisturizing Lotion with Alpha Hydroxy  Curel Extreme Care Body Lotion  Curel Soothing Hands Moisturizing Hand Lotion  Curel Therapeutic Moisturizing Cream, Fragrance-Free  Curel Therapeutic Moisturizing Lotion, Fragrance-Free  Curel Therapeutic Moisturizing Lotion, Original Formula  Eucerin Daily Replenishing Lotion  Eucerin Dry Skin Therapy Plus Alpha Hydroxy Crme  Eucerin Dry Skin Therapy Plus Alpha Hydroxy Lotion  Eucerin Original Crme  Eucerin Original Lotion  Eucerin Plus Crme Eucerin Plus Lotion  Eucerin TriLipid Replenishing Lotion  Keri Anti-Bacterial Hand Lotion  Keri Deep Conditioning Original Lotion Dry Skin Formula Softly Scented  Keri Deep Conditioning Original Lotion, Fragrance Free Sensitive Skin Formula  Keri  Lotion Fast Absorbing Fragrance Free Sensitive Skin Formula  Keri Lotion Fast Absorbing Softly Scented Dry Skin Formula  Keri Original Lotion  Keri Skin Renewal Lotion Keri Silky Smooth Lotion  Keri Silky Smooth Sensitive Skin Lotion  Nivea Body Creamy Conditioning Oil  Nivea Body Extra Enriched Teacher, Adult Education Moisturizing Lotion Nivea Crme  Nivea Skin Firming Lotion  NutraDerm 30 Skin Lotion  NutraDerm Skin Lotion  NutraDerm Therapeutic Skin Cream  NutraDerm Therapeutic Skin Lotion  ProShield Protective Hand Cream  Provon moisturizing lotion

## 2023-12-16 NOTE — Progress Notes (Addendum)
 Anesthesia Review:  PCP: Garnette Ore  clearance 09/14/23  in Media Tab  Cardiologist : Verlin- LOV 11/27/23  Lamarr Satterfield,  NP 11/27/23 telephone encounter   PPM/ ICD: Device Orders: Rep Notified:  Chest x-ray : CT Angio chest- 02/19/23  EKG : [place]- Lov 11/27/23  Echo : 12/14/22  Stress test: Cardiac Cath :   Activity level: can do a flight of stairs without difficulty  Sleep Study/ CPAP : none  Fasting Blood Sugar :      / Checks Blood Sugar -- times a day:    Blood Thinner/ Instructions /Last Dose: ASA / Instructions/ Last Dose :  81 mg aspirin    Retired [employer] from  [employer] at [employer]    12/27/2023 1600-  Asked anesthesia PA, Harlene Ward about pt taking Aliskiren am of surgery .  Per Anesthesia  PT instructed  not take Aliskiren am of surgery .  PT normally takes in the am around 0930am PT voiced understanding. To not take Aliskiren am of surgery.

## 2023-12-17 ENCOUNTER — Encounter (HOSPITAL_COMMUNITY)
Admission: RE | Admit: 2023-12-17 | Discharge: 2023-12-17 | Disposition: A | Discharge status: Home / Self Care | Source: Ambulatory Visit | Attending: Orthopedic Surgery | Admitting: Orthopedic Surgery

## 2023-12-17 ENCOUNTER — Other Ambulatory Visit: Payer: Self-pay

## 2023-12-17 ENCOUNTER — Encounter (HOSPITAL_COMMUNITY): Payer: Self-pay

## 2023-12-17 VITALS — BP 139/88 | HR 78 | Temp 98.6°F | Resp 16 | Ht 64.0 in | Wt 178.0 lb

## 2023-12-17 DIAGNOSIS — M1711 Unilateral primary osteoarthritis, right knee: Secondary | ICD-10-CM | POA: Diagnosis not present

## 2023-12-17 DIAGNOSIS — I10 Essential (primary) hypertension: Secondary | ICD-10-CM | POA: Insufficient documentation

## 2023-12-17 DIAGNOSIS — E039 Hypothyroidism, unspecified: Secondary | ICD-10-CM | POA: Insufficient documentation

## 2023-12-17 DIAGNOSIS — Z79890 Hormone replacement therapy (postmenopausal): Secondary | ICD-10-CM | POA: Diagnosis not present

## 2023-12-17 DIAGNOSIS — Z01818 Encounter for other preprocedural examination: Secondary | ICD-10-CM

## 2023-12-17 DIAGNOSIS — Z01812 Encounter for preprocedural laboratory examination: Secondary | ICD-10-CM | POA: Insufficient documentation

## 2023-12-17 HISTORY — DX: Other specified postprocedural states: Z98.890

## 2023-12-17 HISTORY — DX: Nausea with vomiting, unspecified: R11.2

## 2023-12-17 HISTORY — DX: Personal history of urinary calculi: Z87.442

## 2023-12-17 LAB — CBC
HCT: 46.3 % — ABNORMAL HIGH (ref 36.0–46.0)
Hemoglobin: 15.8 g/dL — ABNORMAL HIGH (ref 12.0–15.0)
MCH: 30.6 pg (ref 26.0–34.0)
MCHC: 34.1 g/dL (ref 30.0–36.0)
MCV: 89.6 fL (ref 80.0–100.0)
Platelets: 291 K/uL (ref 150–400)
RBC: 5.17 MIL/uL — ABNORMAL HIGH (ref 3.87–5.11)
RDW: 13.8 % (ref 11.5–15.5)
WBC: 6 K/uL (ref 4.0–10.5)
nRBC: 0 % (ref 0.0–0.2)

## 2023-12-17 LAB — BASIC METABOLIC PANEL WITH GFR
Anion gap: 14 (ref 5–15)
BUN: 19 mg/dL (ref 8–23)
CO2: 21 mmol/L — ABNORMAL LOW (ref 22–32)
Calcium: 9.9 mg/dL (ref 8.9–10.3)
Chloride: 104 mmol/L (ref 98–111)
Creatinine, Ser: 0.94 mg/dL (ref 0.44–1.00)
GFR, Estimated: >60 mL/min (ref >60)
Glucose, Bld: 120 mg/dL — ABNORMAL HIGH (ref 70–99)
Potassium: 4.2 mmol/L (ref 3.5–5.1)
Sodium: 139 mmol/L (ref 135–145)

## 2023-12-17 LAB — SURGICAL PCR SCREEN
MRSA, PCR: NEGATIVE
Staphylococcus aureus: POSITIVE — AB

## 2023-12-18 NOTE — Progress Notes (Signed)
 Anesthesia Chart Review   Case: [id number] Date/Time: 12/30/23 1225   Procedure: ARTHROPLASTY, KNEE, TOTAL (Right: Knee)   Anesthesia type: Choice   Pre-op diagnosis: Right knee osteoarthritis   Location: [facility] ROOM 10 / [facility]   Surgeons: Melodi Lerner, MD       DISCUSSION:78 y.o. never smoker with h/o PONV, HTN, hypothyroidism on Synthroid, right knee OA scheduled for above procedure 12/30/2023 with Dr. Lerner Melodi.   Per cardiology preoperative evaluation 11/27/2023, Pre-operative cardiovascular examination: She has no active cardiac issues. EKG is unchanged. She feels well. Cardiac exam is normal. She can proceed with her planned surgical procedure.  VS: BP 139/88   Pulse 78   Temp 37 C (Oral)   Resp 16   Ht 5' 4 (1.626 m)   Wt 80.7 kg   SpO2 99%   BMI 30.55 kg/m   PROVIDERS: Nichole Senior, MD is PCP   Primary Cardiologist: Lonni Cash, MD  LABS: Labs reviewed: Acceptable for surgery. (all labs ordered are listed, but only abnormal results are displayed)  Labs Reviewed  SURGICAL PCR SCREEN - Abnormal; Notable for the following components:      Result Value   Staphylococcus aureus POSITIVE (*)    All other components within normal limits  BASIC METABOLIC PANEL WITH GFR - Abnormal; Notable for the following components:   CO2 21 (*)    Glucose, Bld 120 (*)    All other components within normal limits  CBC - Abnormal; Notable for the following components:   RBC 5.17 (*)    Hemoglobin 15.8 (*)    HCT 46.3 (*)    All other components within normal limits     IMAGES:   EKG:   CV: Echo 12/14/2022 1. Proximal septal thickening with turbulence in LVOT; no LVOT gradient  at rest.   2. Left ventricular ejection fraction, by estimation, is 60 to 65%. The  left ventricle has normal function. The left ventricle has no regional  wall motion abnormalities. There is moderate left ventricular hypertrophy  of the basal-septal segment. Left  ventricular  diastolic parameters are consistent with Grade I diastolic  dysfunction (impaired relaxation). The average left ventricular global  longitudinal strain is -23.5 %. The global longitudinal strain is normal.   3. Right ventricular systolic function is normal. The right ventricular  size is normal.   4. The mitral valve is normal in structure. No evidence of mitral valve  regurgitation. No evidence of mitral stenosis.   5. The aortic valve is tricuspid. Aortic valve regurgitation is trivial.  No aortic stenosis is present.   6. Aortic dilatation noted. There is mild dilatation of the ascending  aorta, measuring 39 mm.   7. The inferior vena cava is normal in size with greater than 50%  respiratory variability, suggesting right atrial pressure of 3 mmHg.   Past Medical History:  Diagnosis Date   Allergy    seasonal   Anxiety    occasional   Arthritis    Cataract    Diverticulosis    DJD (degenerative joint disease)    Fatty liver disease, nonalcoholic    Gastritis    GERD (gastroesophageal reflux disease)    Gout    Hearing impaired    Heart murmur    MVP   Hemorrhoids    History of kidney stones    Hyperlipidemia    Hypertension    Hypothyroidism    Kidney stone 05/03/2022   Nephrolithiasis    Osteopenia  PONV (postoperative nausea and vomiting)    Thyroid  disease     Past Surgical History:  Procedure Laterality Date   arm surgery Left    bilateral cataract surgery      COLONOSCOPY     DILATION AND CURETTAGE OF UTERUS  02/12/1973   HAND SURGERY Right 02/13/1996   KNEE ARTHROSCOPY Right 02/12/1993   KNEE ARTHROSCOPY Right    OVARIAN CYST SURGERY  02/12/1966   TONSILLECTOMY     TOTAL ABDOMINAL HYSTERECTOMY W/ BILATERAL SALPINGOOPHORECTOMY  02/12/1985   TUBAL LIGATION     WRIST FRACTURE SURGERY Left 02/13/1999    MEDICATIONS:  acetaminophen (TYLENOL 8 HOUR ARTHRITIS PAIN) 650 MG CR tablet   aliskiren (TEKTURNA) 150 MG tablet   ALPRAZolam (XANAX) 0.5 MG tablet    amLODipine (NORVASC) 5 MG tablet   aspirin EC 81 MG tablet   carboxymethylcellulose (REFRESH PLUS) 0.5 % SOLN   Cholecalciferol (REPLESTA) 1.25 MG (50000 UT) WAFR   colchicine 0.6 MG tablet   dicyclomine  (BENTYL ) 10 MG capsule   ezetimibe (ZETIA) 10 MG tablet   famotidine  (PEPCID ) 20 MG tablet   furosemide (LASIX) 20 MG tablet   levothyroxine (SYNTHROID) 88 MCG tablet   methocarbamol (ROBAXIN) 500 MG tablet   potassium citrate (UROCIT-K) 10 MEQ (1080 MG) SR tablet   pravastatin (PRAVACHOL) 40 MG tablet   sodium chloride  (MURO 128) 5 % ophthalmic ointment    0.9 %  sodium chloride  infusion     Alden Bensinger Ward, PA-C [facility] Pre-Surgical Testing [phone]

## 2023-12-20 DIAGNOSIS — H40013 Open angle with borderline findings, low risk, bilateral: Secondary | ICD-10-CM | POA: Diagnosis not present

## 2023-12-25 DIAGNOSIS — M62838 Other muscle spasm: Secondary | ICD-10-CM | POA: Diagnosis not present

## 2023-12-29 ENCOUNTER — Encounter (HOSPITAL_COMMUNITY): Payer: Self-pay | Admitting: Orthopedic Surgery

## 2023-12-29 NOTE — Anesthesia Preprocedure Evaluation (Addendum)
 Anesthesia Evaluation  Patient identified by MRN, date of birth, ID band Patient awake    Reviewed: Allergy & Precautions, NPO status , Patient's Chart, lab work & pertinent test results, reviewed documented beta blocker date and time   History of Anesthesia Complications (+) PONV and history of anesthetic complications  Airway Mallampati: II  TM Distance: >3 FB     Dental no notable dental hx. (+) Caps, Dental Advisory Given   Pulmonary neg pulmonary ROS   Pulmonary exam normal breath sounds clear to auscultation       Cardiovascular hypertension, Pt. on medications Normal cardiovascular exam+ Valvular Problems/Murmurs  Rhythm:Regular Rate:Normal  EKG 11/27/23 Normal sinus rhythm Low voltage QRS Poor R wave progression  Echo 12/14/22 1. Proximal septal thickening with turbulence in LVOT; no LVOT gradient  at rest.   2. Left ventricular ejection fraction, by estimation, is 60 to 65%. The  left ventricle has normal function. The left ventricle has no regional  wall motion abnormalities. There is moderate left ventricular hypertrophy  of the basal-septal segment. Left ventricular diastolic parameters are consistent with Grade I diastolic dysfunction (impaired relaxation). The average left ventricular global  longitudinal strain is -23.5 %. The global longitudinal strain is normal.   3. Right ventricular systolic function is normal. The right ventricular  size is normal.   4. The mitral valve is normal in structure. No evidence of mitral valve  regurgitation. No evidence of mitral stenosis.   5. The aortic valve is tricuspid. Aortic valve regurgitation is trivial.  No aortic stenosis is present.   6. Aortic dilatation noted. There is mild dilatation of the ascending  aorta, measuring 39 mm.   7. The inferior vena cava is normal in size with greater than 50%  respiratory variability, suggesting right atrial pressure of 3 mmHg.      Neuro/Psych   Anxiety     negative neurological ROS     GI/Hepatic Neg liver ROS,GERD  Medicated,,  Endo/Other  Hypothyroidism  Right knee OA Gout HLD  Renal/GU Renal diseaseHx/o nephrolithiasis Lab Results      Component                Value               Date                      NA                       139                 12/17/2023                CL                       104                 12/17/2023                K                        4.2                 12/17/2023                CO2  21 (L)              12/17/2023                BUN                      19                  12/17/2023                CREATININE               0.94                12/17/2023                GFRNONAA                 >60                 12/17/2023                CALCIUM                  9.9                 12/17/2023                ALBUMIN                  3.9                 05/29/2022                GLUCOSE                  120 (H)             12/17/2023             negative genitourinary   Musculoskeletal  (+) Arthritis , Osteoarthritis,  OA right knee DDD lumbar spine   Abdominal  (+) + obese  Peds  Hematology negative hematology ROS (+) Lab Results      Component                Value               Date                      WBC                      6.0                 12/17/2023                HGB                      15.8 (H)            12/17/2023                HCT                      46.3 (H)            12/17/2023                MCV  89.6                12/17/2023                PLT                      291                 12/17/2023              Anesthesia Other Findings   Reproductive/Obstetrics                              Anesthesia Physical Anesthesia Plan  ASA: 3  Anesthesia Plan: Spinal   Post-op Pain Management: Regional block*, Minimal or no pain anticipated, Precedex and Ofirmev IV  (intra-op)*   Induction: Intravenous  PONV Risk Score and Plan: 3 and Treatment may vary due to age or medical condition and Propofol infusion  Airway Management Planned: Natural Airway and Simple Face Mask  Additional Equipment: None  Intra-op Plan:   Post-operative Plan:   Informed Consent: I have reviewed the patients History and Physical, chart, labs and discussed the procedure including the risks, benefits and alternatives for the proposed anesthesia with the patient or authorized representative who has indicated his/her understanding and acceptance.     Dental advisory given  Plan Discussed with: CRNA and Anesthesiologist  Anesthesia Plan Comments:          Anesthesia Quick Evaluation

## 2023-12-30 ENCOUNTER — Encounter (HOSPITAL_COMMUNITY)
Admission: RE | Disposition: A | Discharge status: Home / Self Care | Payer: Self-pay | Source: Ambulatory Visit | Attending: Orthopedic Surgery

## 2023-12-30 ENCOUNTER — Other Ambulatory Visit: Payer: Self-pay

## 2023-12-30 ENCOUNTER — Ambulatory Visit (HOSPITAL_BASED_OUTPATIENT_CLINIC_OR_DEPARTMENT_OTHER): Payer: Self-pay | Admitting: Anesthesiology

## 2023-12-30 ENCOUNTER — Encounter (HOSPITAL_COMMUNITY): Payer: Self-pay | Admitting: Orthopedic Surgery

## 2023-12-30 ENCOUNTER — Other Ambulatory Visit (HOSPITAL_COMMUNITY): Payer: Self-pay

## 2023-12-30 ENCOUNTER — Ambulatory Visit (HOSPITAL_COMMUNITY): Payer: Self-pay | Admitting: Physician Assistant

## 2023-12-30 ENCOUNTER — Observation Stay (HOSPITAL_COMMUNITY)
Admission: RE | Admit: 2023-12-30 | Discharge: 2023-12-31 | Disposition: A | Discharge status: Home / Self Care | Source: Ambulatory Visit | Attending: Orthopedic Surgery | Admitting: Orthopedic Surgery

## 2023-12-30 DIAGNOSIS — Z7982 Long term (current) use of aspirin: Secondary | ICD-10-CM | POA: Diagnosis not present

## 2023-12-30 DIAGNOSIS — Z79899 Other long term (current) drug therapy: Secondary | ICD-10-CM | POA: Diagnosis not present

## 2023-12-30 DIAGNOSIS — M25561 Pain in right knee: Secondary | ICD-10-CM | POA: Diagnosis not present

## 2023-12-30 DIAGNOSIS — I10 Essential (primary) hypertension: Secondary | ICD-10-CM | POA: Diagnosis not present

## 2023-12-30 DIAGNOSIS — F419 Anxiety disorder, unspecified: Secondary | ICD-10-CM

## 2023-12-30 DIAGNOSIS — E039 Hypothyroidism, unspecified: Secondary | ICD-10-CM

## 2023-12-30 DIAGNOSIS — Z01818 Encounter for other preprocedural examination: Secondary | ICD-10-CM

## 2023-12-30 DIAGNOSIS — G8918 Other acute postprocedural pain: Secondary | ICD-10-CM | POA: Diagnosis not present

## 2023-12-30 DIAGNOSIS — M1711 Unilateral primary osteoarthritis, right knee: Secondary | ICD-10-CM | POA: Diagnosis not present

## 2023-12-30 DIAGNOSIS — Z91040 Latex allergy status: Secondary | ICD-10-CM | POA: Diagnosis not present

## 2023-12-30 HISTORY — PX: TOTAL KNEE ARTHROPLASTY: SHX125

## 2023-12-30 SURGERY — ARTHROPLASTY, KNEE, TOTAL
Anesthesia: Spinal | Site: Knee | Laterality: Right

## 2023-12-30 MED ORDER — SODIUM CHLORIDE (PF) 0.9 % IJ SOLN
INTRAMUSCULAR | Status: DC | PRN
  Administered 2023-12-30: 60 mL

## 2023-12-30 MED ORDER — ORAL CARE MOUTH RINSE: 15.0000 mL | Freq: Once | OROMUCOSAL | Status: AC

## 2023-12-30 MED ORDER — CHLORHEXIDINE GLUCONATE 4 % EX SOLN
1.0000 | CUTANEOUS | 1 refills | Status: AC
  Filled 2023-12-30: qty 946, 42d supply, fill #0

## 2023-12-30 MED ORDER — BUPIVACAINE LIPOSOME 1.3 % IJ SUSP
INTRAMUSCULAR | Status: AC
Start: 2023-12-30 — End: 2023-12-30
  Filled 2023-12-30: qty 20

## 2023-12-30 MED ORDER — METHOCARBAMOL 1000 MG/10ML IJ SOLN
500.0000 mg | Freq: Four times a day (QID) | INTRAMUSCULAR | Status: DC | PRN
Start: 2023-12-30 — End: 2023-12-31

## 2023-12-30 MED ORDER — DOCUSATE SODIUM 100 MG PO CAPS
100.0000 mg | ORAL_CAPSULE | Freq: Two times a day (BID) | ORAL | Status: DC
  Administered 2023-12-30: 100 mg via ORAL
  Filled 2023-12-30 (×2): qty 1

## 2023-12-30 MED ORDER — BUPIVACAINE LIPOSOME 1.3 % IJ SUSP
INTRAMUSCULAR | Status: DC | PRN
  Administered 2023-12-30: 20 mL

## 2023-12-30 MED ORDER — 0.9 % SODIUM CHLORIDE (POUR BTL) OPTIME
TOPICAL | Status: DC | PRN
  Administered 2023-12-30: 1000 mL

## 2023-12-30 MED ORDER — EZETIMIBE 10 MG PO TABS
10.0000 mg | ORAL_TABLET | Freq: Every day | ORAL | Status: DC
  Administered 2023-12-31: 10 mg via ORAL
  Filled 2023-12-30: qty 1

## 2023-12-30 MED ORDER — SODIUM CHLORIDE (PF) 0.9 % IJ SOLN
INTRAMUSCULAR | Status: AC
Start: 2023-12-30 — End: 2023-12-30
  Filled 2023-12-30: qty 10

## 2023-12-30 MED ORDER — MENTHOL 3 MG MT LOZG: 1.0000 | LOZENGE | OROMUCOSAL | Status: DC | PRN

## 2023-12-30 MED ORDER — PROPOFOL 500 MG/50ML IV EMUL
INTRAVENOUS | Status: DC | PRN
Start: 2023-12-30 — End: 2023-12-30
  Administered 2023-12-30: 50 ug/kg/min via INTRAVENOUS
  Administered 2023-12-30: 20 mg via INTRAVENOUS
  Administered 2023-12-30: 30 mg via INTRAVENOUS

## 2023-12-30 MED ORDER — MUPIROCIN 2 % EX OINT
1.0000 | TOPICAL_OINTMENT | Freq: Two times a day (BID) | CUTANEOUS | 0 refills | Status: AC
  Filled 2023-12-30: qty 22, 11d supply, fill #0

## 2023-12-30 MED ORDER — PROPOFOL 1000 MG/100ML IV EMUL
INTRAVENOUS | Status: AC
  Filled 2023-12-30: qty 100

## 2023-12-30 MED ORDER — HYDROMORPHONE HCL 1 MG/ML IJ SOLN
0.5000 mg | INTRAMUSCULAR | Status: DC | PRN
  Administered 2023-12-30 – 2023-12-31 (×2): 1 mg via INTRAVENOUS
  Filled 2023-12-30 (×2): qty 1

## 2023-12-30 MED ORDER — BISACODYL 10 MG RE SUPP
10.0000 mg | Freq: Every day | RECTAL | Status: DC | PRN
Start: 2023-12-30 — End: 2023-12-31

## 2023-12-30 MED ORDER — ONDANSETRON HCL 4 MG/2ML IJ SOLN: 4.0000 mg | Freq: Once | INTRAMUSCULAR | Status: DC | PRN

## 2023-12-30 MED ORDER — CHLORHEXIDINE GLUCONATE 0.12 % MT SOLN
15.0000 mL | Freq: Once | OROMUCOSAL | Status: AC
  Administered 2023-12-30: 15 mL via OROMUCOSAL

## 2023-12-30 MED ORDER — AMLODIPINE BESYLATE 5 MG PO TABS: 5.0000 mg | ORAL_TABLET | Freq: Every day | ORAL | Status: DC

## 2023-12-30 MED ORDER — ROPIVACAINE HCL 5 MG/ML IJ SOLN
INTRAMUSCULAR | Status: DC | PRN
  Administered 2023-12-30: 30 mL via PERINEURAL

## 2023-12-30 MED ORDER — DROPERIDOL 2.5 MG/ML IJ SOLN: 0.6250 mg | Freq: Once | INTRAMUSCULAR | Status: DC | PRN

## 2023-12-30 MED ORDER — ONDANSETRON HCL 4 MG/2ML IJ SOLN
INTRAMUSCULAR | Status: AC
  Filled 2023-12-30: qty 2

## 2023-12-30 MED ORDER — METOCLOPRAMIDE HCL 5 MG PO TABS: 5.0000 mg | ORAL_TABLET | Freq: Three times a day (TID) | ORAL | Status: DC | PRN

## 2023-12-30 MED ORDER — HYDROMORPHONE HCL 1 MG/ML IJ SOLN
0.2500 mg | INTRAMUSCULAR | Status: DC | PRN
  Administered 2023-12-30 (×2): 0.5 mg via INTRAVENOUS

## 2023-12-30 MED ORDER — FAMOTIDINE 20 MG PO TABS: 20.0000 mg | ORAL_TABLET | Freq: Two times a day (BID) | ORAL | Status: DC | PRN

## 2023-12-30 MED ORDER — RIVAROXABAN 10 MG PO TABS
10.0000 mg | ORAL_TABLET | Freq: Every day | ORAL | Status: DC
  Administered 2023-12-31: 10 mg via ORAL
  Filled 2023-12-30: qty 1

## 2023-12-30 MED ORDER — PROPOFOL 10 MG/ML IV BOLUS
INTRAVENOUS | Status: AC
  Filled 2023-12-30: qty 20

## 2023-12-30 MED ORDER — POLYETHYLENE GLYCOL 3350 17 G PO PACK: 17.0000 g | PACK | Freq: Every day | ORAL | Status: DC | PRN

## 2023-12-30 MED ORDER — FLEET ENEMA RE ENEM: 1.0000 | ENEMA | Freq: Once | RECTAL | Status: DC | PRN

## 2023-12-30 MED ORDER — METOCLOPRAMIDE HCL 5 MG/ML IJ SOLN
5.0000 mg | Freq: Three times a day (TID) | INTRAMUSCULAR | Status: DC | PRN
  Administered 2023-12-30: 5 mg via INTRAVENOUS
  Filled 2023-12-30: qty 2

## 2023-12-30 MED ORDER — OXYCODONE HCL 5 MG PO TABS
5.0000 mg | ORAL_TABLET | ORAL | Status: DC | PRN
  Administered 2023-12-30: 5 mg via ORAL
  Administered 2023-12-31 (×2): 10 mg via ORAL
  Administered 2023-12-31: 5 mg via ORAL
  Filled 2023-12-30 (×3): qty 1
  Filled 2023-12-30: qty 2
  Filled 2023-12-30: qty 1

## 2023-12-30 MED ORDER — ACETAMINOPHEN 10 MG/ML IV SOLN
1000.0000 mg | Freq: Four times a day (QID) | INTRAVENOUS | Status: DC
  Administered 2023-12-30: 1000 mg via INTRAVENOUS
  Filled 2023-12-30: qty 100

## 2023-12-30 MED ORDER — EPHEDRINE SULFATE (PRESSORS) 25 MG/5ML IV SOSY
PREFILLED_SYRINGE | INTRAVENOUS | Status: DC | PRN
  Administered 2023-12-30: 10 mg via INTRAVENOUS

## 2023-12-30 MED ORDER — FENTANYL CITRATE (PF) 50 MCG/ML IJ SOSY
50.0000 ug | PREFILLED_SYRINGE | Freq: Once | INTRAMUSCULAR | Status: AC
Start: 2023-12-30 — End: 2023-12-30
  Administered 2023-12-30: 50 ug via INTRAVENOUS
  Filled 2023-12-30: qty 2

## 2023-12-30 MED ORDER — DEXAMETHASONE SOD PHOSPHATE PF 10 MG/ML IJ SOLN
10.0000 mg | Freq: Once | INTRAMUSCULAR | Status: AC
  Administered 2023-12-31: 10 mg via INTRAVENOUS

## 2023-12-30 MED ORDER — HYDROMORPHONE HCL 1 MG/ML IJ SOLN
INTRAMUSCULAR | Status: AC
  Filled 2023-12-30: qty 1

## 2023-12-30 MED ORDER — SODIUM CHLORIDE 0.9 % IV SOLN: INTRAVENOUS | Status: DC

## 2023-12-30 MED ORDER — CEFAZOLIN SODIUM-DEXTROSE 2-4 GM/100ML-% IV SOLN
2.0000 g | Freq: Four times a day (QID) | INTRAVENOUS | Status: AC
  Administered 2023-12-30 (×2): 2 g via INTRAVENOUS
  Filled 2023-12-30 (×2): qty 100

## 2023-12-30 MED ORDER — PHENYLEPHRINE HCL-NACL 20-0.9 MG/250ML-% IV SOLN
INTRAVENOUS | Status: DC | PRN
  Administered 2023-12-30: 50 ug/min via INTRAVENOUS

## 2023-12-30 MED ORDER — FUROSEMIDE 20 MG PO TABS
20.0000 mg | ORAL_TABLET | Freq: Every day | ORAL | Status: DC
  Administered 2023-12-31: 20 mg via ORAL
  Filled 2023-12-30: qty 1

## 2023-12-30 MED ORDER — METHOCARBAMOL 500 MG PO TABS
500.0000 mg | ORAL_TABLET | Freq: Four times a day (QID) | ORAL | Status: DC | PRN
  Administered 2023-12-30 – 2023-12-31 (×2): 500 mg via ORAL
  Filled 2023-12-30 (×2): qty 1

## 2023-12-30 MED ORDER — SODIUM CHLORIDE 0.9 % IR SOLN
Status: DC | PRN
  Administered 2023-12-30: 1000 mL

## 2023-12-30 MED ORDER — TRAMADOL HCL 50 MG PO TABS: 50.0000 mg | ORAL_TABLET | Freq: Four times a day (QID) | ORAL | Status: DC | PRN

## 2023-12-30 MED ORDER — ONDANSETRON HCL 4 MG/2ML IJ SOLN
INTRAMUSCULAR | Status: DC | PRN
  Administered 2023-12-30: 4 mg via INTRAVENOUS

## 2023-12-30 MED ORDER — PRAVASTATIN SODIUM 20 MG PO TABS
40.0000 mg | ORAL_TABLET | Freq: Every day | ORAL | Status: DC
  Administered 2023-12-31: 40 mg via ORAL
  Filled 2023-12-30: qty 2

## 2023-12-30 MED ORDER — ONDANSETRON HCL 4 MG PO TABS: 4.0000 mg | ORAL_TABLET | Freq: Four times a day (QID) | ORAL | Status: DC | PRN

## 2023-12-30 MED ORDER — OXYCODONE HCL 5 MG PO TABS: 5.0000 mg | ORAL_TABLET | Freq: Once | ORAL | Status: DC | PRN

## 2023-12-30 MED ORDER — LEVOTHYROXINE SODIUM 88 MCG PO TABS
88.0000 ug | ORAL_TABLET | Freq: Every day | ORAL | Status: DC
  Administered 2023-12-31: 88 ug via ORAL
  Filled 2023-12-30: qty 1

## 2023-12-30 MED ORDER — STERILE WATER FOR IRRIGATION IR SOLN
Status: DC | PRN
  Administered 2023-12-30: 1000 mL

## 2023-12-30 MED ORDER — POVIDONE-IODINE 10 % EX SWAB: 2.0000 | Freq: Once | CUTANEOUS | Status: DC

## 2023-12-30 MED ORDER — MIDAZOLAM HCL (PF) 2 MG/2ML IJ SOLN
1.0000 mg | Freq: Once | INTRAMUSCULAR | Status: DC
  Filled 2023-12-30: qty 2

## 2023-12-30 MED ORDER — PHENOL 1.4 % MT LIQD: 1.0000 | OROMUCOSAL | Status: DC | PRN

## 2023-12-30 MED ORDER — OXYCODONE HCL 5 MG/5ML PO SOLN: 5.0000 mg | Freq: Once | ORAL | Status: DC | PRN

## 2023-12-30 MED ORDER — ACETAMINOPHEN 325 MG PO TABS: 325.0000 mg | ORAL_TABLET | Freq: Four times a day (QID) | ORAL | Status: DC | PRN

## 2023-12-30 MED ORDER — TRANEXAMIC ACID-NACL 1000-0.7 MG/100ML-% IV SOLN
1000.0000 mg | INTRAVENOUS | Status: AC
  Administered 2023-12-30: 1000 mg via INTRAVENOUS
  Filled 2023-12-30: qty 100

## 2023-12-30 MED ORDER — DIPHENHYDRAMINE HCL 12.5 MG/5ML PO ELIX: 12.5000 mg | ORAL_SOLUTION | ORAL | Status: DC | PRN

## 2023-12-30 MED ORDER — ONDANSETRON HCL 4 MG/2ML IJ SOLN: 4.0000 mg | Freq: Four times a day (QID) | INTRAMUSCULAR | Status: DC | PRN

## 2023-12-30 MED ORDER — BUPIVACAINE LIPOSOME 1.3 % IJ SUSP: 20.0000 mL | Freq: Once | INTRAMUSCULAR | Status: DC

## 2023-12-30 MED ORDER — DICYCLOMINE HCL 10 MG PO CAPS
10.0000 mg | ORAL_CAPSULE | Freq: Two times a day (BID) | ORAL | Status: DC | PRN
  Administered 2023-12-30: 10 mg via ORAL
  Filled 2023-12-30: qty 1

## 2023-12-30 MED ORDER — CEFAZOLIN SODIUM-DEXTROSE 2-4 GM/100ML-% IV SOLN
2.0000 g | INTRAVENOUS | Status: AC
  Administered 2023-12-30: 2 g via INTRAVENOUS
  Filled 2023-12-30: qty 100

## 2023-12-30 MED ORDER — ALISKIREN FUMARATE 150 MG PO TABS
150.0000 mg | ORAL_TABLET | Freq: Every day | ORAL | Status: DC
  Administered 2023-12-31: 150 mg via ORAL
  Filled 2023-12-30: qty 1

## 2023-12-30 MED ORDER — ALPRAZOLAM 0.25 MG PO TABS
0.2500 mg | ORAL_TABLET | Freq: Every evening | ORAL | Status: DC | PRN
  Administered 2023-12-30: 0.5 mg via ORAL
  Filled 2023-12-30: qty 2

## 2023-12-30 MED ORDER — ACETAMINOPHEN 500 MG PO TABS
1000.0000 mg | ORAL_TABLET | Freq: Four times a day (QID) | ORAL | Status: AC
  Administered 2023-12-30 – 2023-12-31 (×4): 1000 mg via ORAL
  Filled 2023-12-30 (×4): qty 2

## 2023-12-30 MED ORDER — EPHEDRINE 5 MG/ML INJ
INTRAVENOUS | Status: AC
  Filled 2023-12-30: qty 5

## 2023-12-30 MED ORDER — LACTATED RINGERS IV SOLN: INTRAVENOUS | Status: DC

## 2023-12-30 MED ORDER — POTASSIUM CITRATE ER 10 MEQ (1080 MG) PO TBCR
10.0000 meq | EXTENDED_RELEASE_TABLET | Freq: Every day | ORAL | Status: DC
  Filled 2023-12-30: qty 1

## 2023-12-30 MED ORDER — BUPIVACAINE IN DEXTROSE 0.75-8.25 % IT SOLN
INTRATHECAL | Status: DC | PRN
  Administered 2023-12-30: 1.7 mL via INTRATHECAL

## 2023-12-30 MED ORDER — DEXAMETHASONE SOD PHOSPHATE PF 10 MG/ML IJ SOLN
8.0000 mg | Freq: Once | INTRAMUSCULAR | Status: AC
  Administered 2023-12-30: 8 mg via INTRAVENOUS

## 2023-12-30 MED ORDER — SODIUM CHLORIDE (PF) 0.9 % IJ SOLN
INTRAMUSCULAR | Status: AC
  Filled 2023-12-30: qty 50

## 2023-12-30 SURGICAL SUPPLY — 43 items
ATTUNE PS FEM RT SZ 5 CEM KNEE (Femur) IMPLANT
ATTUNE PSRP INSR SZ5 6 KNEE (Insert) IMPLANT
BAG COUNTER SPONGE SURGICOUNT (BAG) IMPLANT
BAG ZIPLOCK 12X15 (MISCELLANEOUS) ×1 IMPLANT
BASE TIBIAL ROT PLAT SZ 5 KNEE (Knees) IMPLANT
BLADE SAG 18X100X1.27 (BLADE) ×1 IMPLANT
BLADE SAW SGTL 11.0X1.19X90.0M (BLADE) ×1 IMPLANT
BNDG ELASTIC 6INX 5YD STR LF (GAUZE/BANDAGES/DRESSINGS) ×1 IMPLANT
BOWL SMART MIX CTS (DISPOSABLE) ×1 IMPLANT
CEMENT HV SMART SET (Cement) ×2 IMPLANT
COOLER ICEMAN CLASSIC (MISCELLANEOUS) IMPLANT
COVER SURGICAL LIGHT HANDLE (MISCELLANEOUS) ×1 IMPLANT
CUFF TRNQT CYL 34X4.125X (TOURNIQUET CUFF) ×1 IMPLANT
DERMABOND ADVANCED .7 DNX12 (GAUZE/BANDAGES/DRESSINGS) ×1 IMPLANT
DRAPE U-SHAPE 47X51 STRL (DRAPES) ×1 IMPLANT
DRSG AQUACEL AG ADV 3.5X10 (GAUZE/BANDAGES/DRESSINGS) ×1 IMPLANT
DURAPREP 26ML APPLICATOR (WOUND CARE) ×1 IMPLANT
ELECT REM PT RETURN 15FT ADLT (MISCELLANEOUS) ×1 IMPLANT
GLOVE BIO SURGEON STRL SZ 6.5 (GLOVE) IMPLANT
GLOVE BIO SURGEON STRL SZ8 (GLOVE) ×1 IMPLANT
GLOVE BIOGEL PI IND STRL 7.0 (GLOVE) ×1 IMPLANT
GLOVE BIOGEL PI IND STRL 8 (GLOVE) ×1 IMPLANT
GOWN STRL REUS W/ TWL LRG LVL3 (GOWN DISPOSABLE) ×1 IMPLANT
HOLDER FOLEY CATH W/STRAP (MISCELLANEOUS) ×1 IMPLANT
IMMOBILIZER KNEE 20 THIGH 36 (SOFTGOODS) ×1 IMPLANT
KIT TURNOVER KIT A (KITS) ×1 IMPLANT
MANIFOLD NEPTUNE II (INSTRUMENTS) ×1 IMPLANT
NS IRRIG 1000ML POUR BTL (IV SOLUTION) ×1 IMPLANT
PACK TOTAL KNEE CUSTOM (KITS) ×1 IMPLANT
PAD COLD SHLDR WRAP-ON (PAD) IMPLANT
PADDING CAST COTTON 6X4 STRL (CAST SUPPLIES) ×2 IMPLANT
PATELLA MEDIAL ATTUN 35MM KNEE (Knees) IMPLANT
PENCIL SMOKE EVACUATOR (MISCELLANEOUS) ×1 IMPLANT
PIN STEINMAN FIXATION KNEE (PIN) IMPLANT
PROTECTOR NERVE ULNAR (MISCELLANEOUS) ×1 IMPLANT
SET HNDPC FAN SPRY TIP SCT (DISPOSABLE) ×1 IMPLANT
SUT MNCRL AB 4-0 PS2 18 (SUTURE) ×1 IMPLANT
SUT VIC AB 2-0 CT1 TAPERPNT 27 (SUTURE) ×3 IMPLANT
SUTURE STRATFX 0 PDS 27 VIOLET (SUTURE) ×1 IMPLANT
TOWEL GREEN STERILE FF (TOWEL DISPOSABLE) ×1 IMPLANT
TRAY FOLEY MTR SLVR 16FR STAT (SET/KITS/TRAYS/PACK) ×1 IMPLANT
TUBE SUCTION HIGH CAP CLEAR NV (SUCTIONS) ×1 IMPLANT
WRAP KNEE MAXI GEL POST OP (GAUZE/BANDAGES/DRESSINGS) ×1 IMPLANT

## 2023-12-30 NOTE — Anesthesia Procedure Notes (Addendum)
 Anesthesia Regional Block: Adductor canal block   Pre-Anesthetic Checklist: , timeout performed,  Correct Patient, Correct Site, Correct Laterality,  Correct Procedure, Correct Position, site marked,  Risks and benefits discussed,  Surgical consent,  Pre-op evaluation,  At surgeon's request and post-op pain management  Laterality: Right  Prep: chloraprep       Needles:   Needle Type: Echogenic Stimulator Needle     Needle Length: 10cm  Needle Gauge: 21   Needle insertion depth: 7 cm   Additional Needles:   Procedures:,,,, ultrasound used (permanent image in chart),,    Narrative:  Start time: 12/30/2023 10:30 AM End time: 12/30/2023 10:35 AM Injection made incrementally with aspirations every 5 mL.  Performed by: Personally  Anesthesiologist: Jerrye Sharper, MD  Additional Notes: Timeout performed. Patient sedated. Relevant anatomy ID'd using US . Incremental 2-5ml injection of LA with frequent aspiration. Patient tolerated procedure well.

## 2023-12-30 NOTE — Progress Notes (Signed)
 Orthopedic Tech Progress Note Patient Details:  Vanessa Skinner [birth year] [id number] Applied CPM per order. Will remove at 4:30pm.  CPM Right Knee CPM Right Knee: On Right Knee Flexion (Degrees): 40 Right Knee Extension (Degrees): 10  Post Interventions Patient Tolerated: Well Instructions Provided: Adjustment of device, Care of device, Poper ambulation with device Ortho Devices Type of Ortho Device: CPM padding Ortho Device/Splint Location: RLE Ortho Device/Splint Interventions: Ordered, Application, Adjustment   Post Interventions Patient Tolerated: Well Instructions Provided: Adjustment of device, Care of device, Poper ambulation with device  Morna Pink 12/30/2023, 12:59 PM

## 2023-12-30 NOTE — Anesthesia Procedure Notes (Signed)
 Spinal  Patient location during procedure: OR Start time: 12/30/2023 10:42 AM End time: 12/30/2023 10:44 AM Reason for block: surgical anesthesia Staffing Anesthesiologist: Jerrye Sharper, MD Performed by: Jerrye Sharper, MD Authorized by: Jerrye Sharper, MD   Preanesthetic Checklist Completed: patient identified, IV checked, site marked, risks and benefits discussed, surgical consent, monitors and equipment checked, pre-op evaluation and timeout performed Spinal Block Patient position: sitting Prep: DuraPrep and site prepped and draped Patient monitoring: heart rate, cardiac monitor, continuous pulse ox and blood pressure Approach: midline Location: L3-4 Injection technique: single-shot Needle Needle type: Pencan  Needle gauge: 24 G Needle length: 9 cm Needle insertion depth: 7 cm Assessment Sensory level: T6 Events: CSF return Additional Notes Patient tolerated procedure well. Adequate sensory level.

## 2023-12-30 NOTE — Anesthesia Postprocedure Evaluation (Signed)
 Anesthesia Post Note  Patient: Vanessa Skinner  Procedure(s) Performed: ARTHROPLASTY, KNEE, TOTAL (Right: Knee)     Patient location during evaluation: PACU Anesthesia Type: Spinal Level of consciousness: oriented and awake and alert Pain management: pain level controlled Vital Signs Assessment: post-procedure vital signs reviewed and stable Respiratory status: spontaneous breathing, respiratory function stable and nonlabored ventilation Cardiovascular status: blood pressure returned to baseline and stable Postop Assessment: no headache, no backache, no apparent nausea or vomiting, spinal receding and patient able to bend at knees Anesthetic complications: no   No notable events documented.  Last Vitals:  Vitals:   12/30/23 1245 12/30/23 1300  BP: 126/60 127/66  Pulse: 62 63  Resp: 14 (!) 25  Temp:    SpO2: 95% 95%    Last Pain:  Vitals:   12/30/23 1300  TempSrc:   PainSc: Asleep    LLE Motor Response: Purposeful movement (12/30/23 1300) LLE Sensation: Decreased;Numbness (12/30/23 1300) RLE Motor Response: Purposeful movement (12/30/23 1300) RLE Sensation: Decreased;Numbness (12/30/23 1300) L Sensory Level: S1-Sole of foot, small toes (12/30/23 1300) R Sensory Level: S1-Sole of foot, small toes (12/30/23 1300)  Mary-Ann Pennella A.

## 2023-12-30 NOTE — Transfer of Care (Signed)
 Immediate Anesthesia Transfer of Care Note  Patient: Dickey DELENA Gab  Procedure(s) Performed: ARTHROPLASTY, KNEE, TOTAL (Right: Knee)  Patient Location: PACU  Anesthesia Type:Spinal  Level of Consciousness: awake, alert , and oriented  Airway & Oxygen Therapy: Patient Spontanous Breathing  Post-op Assessment: Report given to RN and Post -op Vital signs reviewed and stable  Post vital signs: Reviewed and stable  Last Vitals:  Vitals Value Taken Time  BP 104/55 12/30/23 12:09  Temp    Pulse 67 12/30/23 12:10  Resp 15 12/30/23 12:10  SpO2 92 % 12/30/23 12:10  Vitals shown include unfiled device data.  Last Pain:  Vitals:   12/30/23 1035  TempSrc:   PainSc: 0-No pain         Complications: No notable events documented.

## 2023-12-30 NOTE — Interval H&P Note (Signed)
 History and Physical Interval Note:  12/30/2023 8:43 AM  Vanessa Skinner  has presented today for surgery, with the diagnosis of Right knee osteoarthritis.  The various methods of treatment have been discussed with the patient and family. After consideration of risks, benefits and other options for treatment, the patient has consented to  Procedure(s): ARTHROPLASTY, KNEE, TOTAL (Right) as a surgical intervention.  The patient's history has been reviewed, patient examined, no change in status, stable for surgery.  I have reviewed the patient's chart and labs.  Questions were answered to the patient's satisfaction.     Dempsey Benjimen Kelley

## 2023-12-30 NOTE — Evaluation (Addendum)
 Physical Therapy Evaluation Patient Details Name: Vanessa Skinner MRN: [id number] DOB: 07-22-1945 Today's Date: 12/30/2023  History of Present Illness  78 yo female s/p R TKA 12/30/23. Hx of gout, osteopenia  Clinical Impression  On eval POD 0, pt required Mod A for mobility. She was able to stand and take a couple side steps along bedside with RW. Further attempts at mobility were limited by significant pain and dizziness. Assisted pt back to bed. Will continue to follow and progress activity as tolerated. Plan is for OPPT f/u.        If plan is discharge home, recommend the following: A little help with walking and/or transfers;A little help with bathing/dressing/bathroom;Assistance with cooking/housework;Assist for transportation;Help with stairs or ramp for entrance   Can travel by private vehicle        Equipment Recommendations None recommended by PT  Recommendations for Other Services       Functional Status Assessment Patient has had a recent decline in their functional status and demonstrates the ability to make significant improvements in function in a reasonable and predictable amount of time.     Precautions / Restrictions Precautions Precautions: Knee;Fall Restrictions Weight Bearing Restrictions Per Provider Order: No RLE Weight Bearing Per Provider Order: Weight bearing as tolerated      Mobility  Bed Mobility Overal bed mobility: Needs Assistance Bed Mobility: Supine to Sit     Supine to sit: Min assist, HOB elevated, Used rails     General bed mobility comments: Cues for safety, technique. Assist for R LE. Increased time and effort for pt    Transfers Overall transfer level: Needs assistance Equipment used: Rolling walker (2 wheels) Transfers: Sit to/from Stand Sit to Stand: From elevated surface, Mod assist           General transfer comment: Cues for safety, technqiue, hand/LE placement. Assist to power up, stabilize, control descent.     Ambulation/Gait               General Gait Details: side steps along bedside only with RW. Cues for safety, technique, sequencing. Pt reported dizziness and requested to sit down. Further attempts at ambulation with ambulation were limited by pain and dizziness.  Stairs            Wheelchair Mobility     Tilt Bed    Modified Rankin (Stroke Patients Only)       Balance Overall balance assessment: Needs assistance         Standing balance support: Bilateral upper extremity supported, During functional activity, Reliant on assistive device for balance Standing balance-Leahy Scale: Fair                               Pertinent Vitals/Pain Pain Assessment Pain Assessment: 0-10 Pain Score: 8  Pain Location: R knee with activity-posterior; catheter discomfort Pain Descriptors / Indicators: Grimacing, Operative site guarding, Aching Pain Intervention(s): Limited activity within patient's tolerance, Monitored during session, Ice applied, Repositioned    Home Living Family/patient expects to be discharged to:: Private residence Living Arrangements: Spouse/significant other Available Help at Discharge: Family Type of Home: House Home Access: Stairs to enter Entrance Stairs-Rails: None Entrance Stairs-Number of Steps: 3   Home Layout: One level Home Equipment: Agricultural Consultant (2 wheels);Cane - single point      Prior Function Prior Level of Function : Independent/Modified Independent  Extremity/Trunk Assessment   Upper Extremity Assessment Upper Extremity Assessment: Generalized weakness    Lower Extremity Assessment Lower Extremity Assessment: Generalized weakness    Cervical / Trunk Assessment Cervical / Trunk Assessment: Normal  Communication   Communication Communication: No apparent difficulties    Cognition Arousal: Alert Behavior During Therapy: WFL for tasks assessed/performed   PT - Cognitive  impairments: No apparent impairments                         Following commands: Intact       Cueing Cueing Techniques: Verbal cues     General Comments      Exercises     Assessment/Plan    PT Assessment Patient needs continued PT services  PT Problem List Decreased strength;Decreased range of motion;Decreased activity tolerance;Decreased balance;Decreased mobility;Decreased knowledge of use of DME;Pain       PT Treatment Interventions DME instruction;Gait training;Stair training;Functional mobility training;Therapeutic exercise;Therapeutic activities;Patient/family education;Balance training    PT Goals (Current goals can be found in the Care Plan section)  Acute Rehab PT Goals Patient Stated Goal: less pain. return home PT Goal Formulation: With patient Time For Goal Achievement: 01/13/24 Potential to Achieve Goals: Good    Frequency 7X/week     Co-evaluation               AM-PAC PT 6 Clicks Mobility  Outcome Measure Help needed turning from your back to your side while in a flat bed without using bedrails?: A Little Help needed moving from lying on your back to sitting on the side of a flat bed without using bedrails?: A Little Help needed moving to and from a bed to a chair (including a wheelchair)?: A Lot Help needed standing up from a chair using your arms (e.g., wheelchair or bedside chair)?: A Lot Help needed to walk in hospital room?: A Lot Help needed climbing 3-5 steps with a railing? : Total 6 Click Score: 13    End of Session Equipment Utilized During Treatment: Gait belt Activity Tolerance: Patient tolerated treatment well Patient left: in bed;with call bell/phone within reach;with bed alarm set;with family/visitor present Nurse Communication: Pt requested pain meds-used call bell to front desk to request  PT Visit Diagnosis: Muscle weakness (generalized) (M62.81);Difficulty in walking, not elsewhere classified (R26.2);Pain Pain -  Right/Left: Right Pain - part of body: Knee    Time: 8347-8287 PT Time Calculation (min) (ACUTE ONLY): 20 min   Charges:   PT Evaluation $PT Eval Low Complexity: 1 Low   PT General Charges $$ ACUTE PT VISIT: 1 Visit           Dannial SQUIBB, PT Acute Rehabilitation  Office: [phone]

## 2023-12-30 NOTE — Discharge Instructions (Addendum)
 Frank Aluisio, MD Total Joint Specialist [facility] [address] [place], [place] [address] [phone]  TOTAL KNEE REPLACEMENT POSTOPERATIVE DIRECTIONS    Knee Rehabilitation, Guidelines Following Surgery  Results after knee surgery are often greatly improved when you follow the exercise, range of motion and muscle strengthening exercises prescribed by your doctor. Safety measures are also important to protect the knee from further injury. If any of these exercises cause you to have increased pain or swelling in your knee joint, decrease the amount until you are comfortable again and slowly increase them. If you have problems or questions, call your caregiver or physical therapist for advice.   BLOOD CLOT PREVENTION Take a 10 mg Xarelto once a day for three weeks following surgery. Then resume one 81 mg aspirin once a day. You may resume your vitamins/supplements once you have discontinued the Xarelto. Do not take any NSAIDs (Advil, Aleve, Ibuprofen, Meloxicam, etc.) until you have discontinued the Xarelto.   HOME CARE INSTRUCTIONS  Remove items at home which could result in a fall. This includes throw rugs or furniture in walking pathways.  ICE to the affected knee as much as tolerated. Icing helps control swelling. If the swelling is well controlled you will be more comfortable and rehab easier. Continue to use ice on the knee for pain and swelling from surgery. You may notice swelling that will progress down to the foot and ankle. This is normal after surgery. Elevate the leg when you are not up walking on it.    Continue to use the breathing machine which will help keep your temperature down. It is common for your temperature to cycle up and down following surgery, especially at night when you are not up moving around and exerting yourself. The breathing machine keeps your lungs expanded and your temperature down. Do not place pillow under the operative  knee, focus on keeping the knee straight while resting  DIET You may resume your previous home diet once you are discharged from the hospital.  DRESSING / WOUND CARE / SHOWERING Keep your bulky bandage on for 2 days. On the third post-operative day you may remove the Ace bandage and gauze. There is a waterproof adhesive bandage on your skin which will stay in place until your first follow-up appointment. Once you remove this you will not need to place another bandage You may begin showering 3 days following surgery, but do not submerge the incision under water.  ACTIVITY For the first 5 days, the key is rest and control of pain and swelling Do your home exercises twice a day starting on post-operative day 3. On the days you go to physical therapy, just do the home exercises once that day. You should rest, ice and elevate the leg for 50 minutes out of every hour. Get up and walk/stretch for 10 minutes per hour. After 5 days you can increase your activity slowly as tolerated. Walk with your walker as instructed. Use the walker until you are comfortable transitioning to a cane. Walk with the cane in the opposite hand of the operative leg. You may discontinue the cane once you are comfortable and walking steadily. Avoid periods of inactivity such as sitting longer than an hour when not asleep. This helps prevent blood clots.  You may discontinue the knee immobilizer once you are able to perform a straight leg raise while lying down. You may resume a sexual relationship in one month or when given the OK by your   doctor.  You may return to work once you are cleared by your doctor.  Do not drive a car for 6 weeks or until released by your surgeon.  Do not drive while taking narcotics.  TED HOSE STOCKINGS Wear the elastic stockings on both legs for three weeks following surgery during the day. You may remove them at night for sleeping.  WEIGHT BEARING Weight bearing as tolerated with assist device  (walker, cane, etc) as directed, use it as long as suggested by your surgeon or therapist, typically at least 4-6 weeks.  POSTOPERATIVE CONSTIPATION PROTOCOL Constipation - defined medically as fewer than three stools per week and severe constipation as less than one stool per week.  One of the most common issues patients have following surgery is constipation.  Even if you have a regular bowel pattern at home, your normal regimen is likely to be disrupted due to multiple reasons following surgery.  Combination of anesthesia, postoperative narcotics, change in appetite and fluid intake all can affect your bowels.  In order to avoid complications following surgery, here are some recommendations in order to help you during your recovery period.  Colace (docusate) - Pick up an over-the-counter form of Colace or another stool softener and take twice a day as long as you are requiring postoperative pain medications.  Take with a full glass of water daily.  If you experience loose stools or diarrhea, hold the colace until you stool forms back up. If your symptoms do not get better within 1 week or if they get worse, check with your doctor. Dulcolax (bisacodyl) - Pick up over-the-counter and take as directed by the product packaging as needed to assist with the movement of your bowels.  Take with a full glass of water.  Use this product as needed if not relieved by Colace only.  MiraLax (polyethylene glycol) - Pick up over-the-counter to have on hand. MiraLax is a solution that will increase the amount of water in your bowels to assist with bowel movements.  Take as directed and can mix with a glass of water, juice, soda, coffee, or tea. Take if you go more than two days without a movement. Do not use MiraLax more than once per day. Call your doctor if you are still constipated or irregular after using this medication for 7 days in a row.  If you continue to have problems with postoperative constipation, please  contact the office for further assistance and recommendations.  If you experience "the worst abdominal pain ever" or develop nausea or vomiting, please contact the office immediatly for further recommendations for treatment.  ITCHING If you experience itching with your medications, try taking only a single pain pill, or even half a pain pill at a time.  You can also use Benadryl over the counter for itching or also to help with sleep.   MEDICATIONS See your medication summary on the "After Visit Summary" that the nursing staff will review with you prior to discharge.  You may have some home medications which will be placed on hold until you complete the course of blood thinner medication.  It is important for you to complete the blood thinner medication as prescribed by your surgeon.  Continue your approved medications as instructed at time of discharge.  PRECAUTIONS If you experience chest pain or shortness of breath - call 911 immediately for transfer to the hospital emergency department.  If you develop a fever greater that 101 F, purulent drainage from wound, increased redness or   drainage from wound, foul odor from the wound/dressing, or calf pain - CONTACT YOUR SURGEON.                                                   FOLLOW-UP APPOINTMENTS Make sure you keep all of your appointments after your operation with your surgeon and caregivers. You should call the office at the above phone number and make an appointment for approximately two weeks after the date of your surgery or on the date instructed by your surgeon outlined in the "After Visit Summary".  RANGE OF MOTION AND STRENGTHENING EXERCISES  Rehabilitation of the knee is important following a knee injury or an operation. After just a few days of immobilization, the muscles of the thigh which control the knee become weakened and shrink (atrophy). Knee exercises are designed to build up the tone and strength of the thigh muscles and to  improve knee motion. Often times heat used for twenty to thirty minutes before working out will loosen up your tissues and help with improving the range of motion but do not use heat for the first two weeks following surgery. These exercises can be done on a training (exercise) mat, on the floor, on a table or on a bed. Use what ever works the best and is most comfortable for you Knee exercises include:  Leg Lifts - While your knee is still immobilized in a splint or cast, you can do straight leg raises. Lift the leg to 60 degrees, hold for 3 sec, and slowly lower the leg. Repeat 10-20 times 2-3 times daily. Perform this exercise against resistance later as your knee gets better.  Quad and Hamstring Sets - Tighten up the muscle on the front of the thigh (Quad) and hold for 5-10 sec. Repeat this 10-20 times hourly. Hamstring sets are done by pushing the foot backward against an object and holding for 5-10 sec. Repeat as with quad sets.  Leg Slides: Lying on your back, slowly slide your foot toward your buttocks, bending your knee up off the floor (only go as far as is comfortable). Then slowly slide your foot back down until your leg is flat on the floor again. Angel Wings: Lying on your back spread your legs to the side as far apart as you can without causing discomfort.  A rehabilitation program following serious knee injuries can speed recovery and prevent re-injury in the future due to weakened muscles. Contact your doctor or a physical therapist for more information on knee rehabilitation.   POST-OPERATIVE OPIOID TAPER INSTRUCTIONS: It is important to wean off of your opioid medication as soon as possible. If you do not need pain medication after your surgery it is ok to stop day one. Opioids include: Codeine, Hydrocodone(Norco, Vicodin), Oxycodone(Percocet, oxycontin) and hydromorphone amongst others.  Long term and even short term use of opiods can cause: Increased pain  response Dependence Constipation Depression Respiratory depression And more.  Withdrawal symptoms can include Flu like symptoms Nausea, vomiting And more Techniques to manage these symptoms Hydrate well Eat regular healthy meals Stay active Use relaxation techniques(deep breathing, meditating, yoga) Do Not substitute Alcohol to help with tapering If you have been on opioids for less than two weeks and do not have pain than it is ok to stop all together.  Plan to wean off of opioids This plan  should start within one week post op of your joint replacement. Maintain the same interval or time between taking each dose and first decrease the dose.  Cut the total daily intake of opioids by one tablet each day Next start to increase the time between doses. The last dose that should be eliminated is the evening dose.   IF YOU ARE TRANSFERRED TO A SKILLED REHAB FACILITY If the patient is transferred to a skilled rehab facility following release from the hospital, a list of the current medications will be sent to the facility for the patient to continue.  When discharged from the skilled rehab facility, please have the facility set up the patient's Home Health Physical Therapy prior to being released. Also, the skilled facility will be responsible for providing the patient with their medications at time of release from the facility to include their pain medication, the muscle relaxants, and their blood thinner medication. If the patient is still at the rehab facility at time of the two week follow up appointment, the skilled rehab facility will also need to assist the patient in arranging follow up appointment in our office and any transportation needs.  MAKE SURE YOU:  Understand these instructions.  Get help right away if you are not doing well or get worse.   DENTAL ANTIBIOTICS:  In most cases prophylactic antibiotics for Dental procdeures after total joint surgery are not  necessary.  Exceptions are as follows:  1. History of prior total joint infection  2. Severely immunocompromised (Organ Transplant, cancer chemotherapy, Rheumatoid biologic meds such as Humera)  3. Poorly controlled diabetes (A1C &gt; 8.0, blood glucose over 200)  If you have one of these conditions, contact your surgeon for an antibiotic prescription, prior to your dental procedure.    Pick up stool softner and laxative for home use following surgery while on pain medications. Do not submerge incision under water. Please use good hand washing techniques while changing dressing each day. May shower starting three days after surgery. Please use a clean towel to pat the incision dry following showers. Continue to use ice for pain and swelling after surgery. Do not use any lotions or creams on the incision until instructed by your surgeon.      Information on my medicine - XARELTO (Rivaroxaban)  Why was Xarelto prescribed for you? Xarelto was prescribed for you to reduce the risk of blood clots forming after orthopedic surgery. The medical term for these abnormal blood clots is venous thromboembolism (VTE).  What do you need to know about xarelto ? Take your Xarelto ONCE DAILY at the same time every day. You may take it either with or without food.  If you have difficulty swallowing the tablet whole, you may crush it and mix in applesauce just prior to taking your dose.  Take Xarelto exactly as prescribed by your doctor and DO NOT stop taking Xarelto without talking to the doctor who prescribed the medication.  Stopping without other VTE prevention medication to take the place of Xarelto may increase your risk of developing a clot.  After discharge, you should have regular check-up appointments with your healthcare provider that is prescribing your Xarelto.    What do you do if you miss a dose? If you miss a dose, take it as soon as you remember on the same day then  continue your regularly scheduled once daily regimen the next day. Do not take two doses of Xarelto on the same day.   Important Safety Information   A possible side effect of Xarelto is bleeding. You should call your healthcare provider right away if you experience any of the following: Bleeding from an injury or your nose that does not stop. Unusual colored urine (red or dark brown) or unusual colored stools (red or black). Unusual bruising for unknown reasons. A serious fall or if you hit your head (even if there is no bleeding).  Some medicines may interact with Xarelto and might increase your risk of bleeding while on Xarelto. To help avoid this, consult your healthcare provider or pharmacist prior to using any new prescription or non-prescription medications, including herbals, vitamins, non-steroidal anti-inflammatory drugs (NSAIDs) and supplements.  This website has more information on Xarelto: www.xarelto.com.   

## 2023-12-30 NOTE — Plan of Care (Signed)

## 2023-12-30 NOTE — Op Note (Signed)
 OPERATIVE REPORT-TOTAL KNEE ARTHROPLASTY   Pre-operative diagnosis- Osteoarthritis  Right knee(s)  Post-operative diagnosis- Osteoarthritis Right knee(s)  Procedure-  Right  Total Knee Arthroplasty  Surgeon- Dempsey GAILS. Sephira Zellman, MD  Assistant- Roxie Mess, PA-C   Anesthesia-  Adductor canal block and spinal  EBL- 25 ml   Drains None  Tourniquet time-  Total Tourniquet Time Documented: Thigh (Right) - 34 minutes Total: Thigh (Right) - 34 minutes     Complications- None  Condition-PACU - hemodynamically stable.   Brief Clinical Note  Vanessa Skinner is a 78 y.o. year old female with end stage OA of her right knee with progressively worsening pain and dysfunction. She has constant pain, with activity and at rest and significant functional deficits with difficulties even with ADLs. She has had extensive non-op management including analgesics, injections of cortisone and viscosupplements, and home exercise program, but remains in significant pain with significant dysfunction.Radiographs show bone on bone arthritis lateral and patellofemoral. She presents now for right Total Knee Arthroplasty.     Procedure in detail---   The patient is brought into the operating room and positioned supine on the operating table. After successful administration of  Adductor canal block and spinal,   a tourniquet is placed high on the  Right thigh(s) and the lower extremity is prepped and draped in the usual sterile fashion. Time out is performed by the operating team and then the  Right lower extremity is wrapped in Esmarch, knee flexed and the tourniquet inflated to 300 mmHg.       A midline incision is made with a ten blade through the subcutaneous tissue to the level of the extensor mechanism. A fresh blade is used to make a medial parapatellar arthrotomy. Soft tissue over the proximal medial tibia is subperiosteally elevated to the joint line with a knife and into the semimembranosus bursa with a  Cobb elevator. Soft tissue over the proximal lateral tibia is elevated with attention being paid to avoiding the patellar tendon on the tibial tubercle. The patella is everted, knee flexed 90 degrees and the ACL and PCL are removed. Findings are bone on bone medial and patellofemoral with large global osteophytes        The drill is used to create a starting hole in the distal femur and the canal is thoroughly irrigated with sterile saline to remove the fatty contents. The 5 degree Right  valgus alignment guide is placed into the femoral canal and the distal femoral cutting block is pinned to remove 10 mm off the distal femur. Resection is made with an oscillating saw.      The tibia is subluxed forward and the menisci are removed. The extramedullary alignment guide is placed referencing proximally at the medial aspect of the tibial tubercle and distally along the second metatarsal axis and tibial crest. The block is pinned to remove 2mm off the more deficient medial  side. Resection is made with an oscillating saw. Size 5is the most appropriate size for the tibia and the proximal tibia is prepared with the modular drill and keel punch for that size.      The femoral sizing guide is placed and size 5 is most appropriate. Rotation is marked off the epicondylar axis and confirmed by creating a rectangular flexion gap at 90 degrees. The size 5 cutting block is pinned in this rotation and the anterior, posterior and chamfer cuts are made with the oscillating saw. The intercondylar block is then placed and that cut is made.  Trial size 5 tibial component, trial size 5 posterior stabilized femur and a 6  mm posterior stabilized rotating platform insert trial is placed. Full extension is achieved with excellent varus/valgus and anterior/posterior balance throughout full range of motion. The patella is everted and thickness measured to be 22  mm. Free hand resection is taken to 12 mm, a 35 template is placed, lug  holes are drilled, trial patella is placed, and it tracks normally. Osteophytes are removed off the posterior femur with the trial in place. All trials are removed and the cut bone surfaces prepared with pulsatile lavage. Cement is mixed and once ready for implantation, the size 5 tibial implant, size  5 posterior stabilized femoral component, and the size 35 patella are cemented in place and the patella is held with the clamp. The trial insert is placed and the knee held in full extension. The Exparel (20 ml mixed with 60 ml saline) is injected into the extensor mechanism, posterior capsule, medial and lateral gutters and subcutaneous tissues.  All extruded cement is removed and once the cement is hard the permanent 6 mm posterior stabilized rotating platform insert is placed into the tibial tray.      The wound is copiously irrigated with saline solution and the extensor mechanism closed with # 0 Stratofix suture. The tourniquet is released for a total tourniquet time of 34  minutes. Flexion against gravity is 140 degrees and the patella tracks normally. Subcutaneous tissue is closed with 2.0 vicryl and subcuticular with running 4.0 Monocryl. The incision is cleaned and dried and steri-strips and a bulky sterile dressing are applied. The limb is placed into a knee immobilizer and the patient is awakened and transported to recovery in stable condition.      Please note that a surgical assistant was a medical necessity for this procedure in order to perform it in a safe and expeditious manner. Surgical assistant was necessary to retract the ligaments and vital neurovascular structures to prevent injury to them and also necessary for proper positioning of the limb to allow for anatomic placement of the prosthesis.   Dempsey ROCKFORD Vanessa Osment, MD    12/30/2023, 11:38 AM

## 2023-12-31 ENCOUNTER — Encounter (HOSPITAL_COMMUNITY): Payer: Self-pay | Admitting: Orthopedic Surgery

## 2023-12-31 ENCOUNTER — Other Ambulatory Visit (HOSPITAL_COMMUNITY): Payer: Self-pay

## 2023-12-31 DIAGNOSIS — M25561 Pain in right knee: Secondary | ICD-10-CM | POA: Diagnosis not present

## 2023-12-31 DIAGNOSIS — M1711 Unilateral primary osteoarthritis, right knee: Secondary | ICD-10-CM | POA: Diagnosis not present

## 2023-12-31 DIAGNOSIS — Z79899 Other long term (current) drug therapy: Secondary | ICD-10-CM | POA: Diagnosis not present

## 2023-12-31 DIAGNOSIS — E039 Hypothyroidism, unspecified: Secondary | ICD-10-CM | POA: Diagnosis not present

## 2023-12-31 DIAGNOSIS — Z91040 Latex allergy status: Secondary | ICD-10-CM | POA: Diagnosis not present

## 2023-12-31 DIAGNOSIS — I10 Essential (primary) hypertension: Secondary | ICD-10-CM | POA: Diagnosis not present

## 2023-12-31 DIAGNOSIS — Z7982 Long term (current) use of aspirin: Secondary | ICD-10-CM | POA: Diagnosis not present

## 2023-12-31 LAB — CBC
HCT: 39.9 % (ref 36.0–46.0)
Hemoglobin: 13.6 g/dL (ref 12.0–15.0)
MCH: 30.9 pg (ref 26.0–34.0)
MCHC: 34.1 g/dL (ref 30.0–36.0)
MCV: 90.7 fL (ref 80.0–100.0)
Platelets: 255 K/uL (ref 150–400)
RBC: 4.4 MIL/uL (ref 3.87–5.11)
RDW: 14.3 % (ref 11.5–15.5)
WBC: 13.6 K/uL — ABNORMAL HIGH (ref 4.0–10.5)
nRBC: 0 % (ref 0.0–0.2)

## 2023-12-31 LAB — BASIC METABOLIC PANEL WITH GFR
Anion gap: 12 (ref 5–15)
BUN: 16 mg/dL (ref 8–23)
CO2: 22 mmol/L (ref 22–32)
Calcium: 9.4 mg/dL (ref 8.9–10.3)
Chloride: 104 mmol/L (ref 98–111)
Creatinine, Ser: 0.74 mg/dL (ref 0.44–1.00)
GFR, Estimated: >60 mL/min (ref >60)
Glucose, Bld: 131 mg/dL — ABNORMAL HIGH (ref 70–99)
Potassium: 4 mmol/L (ref 3.5–5.1)
Sodium: 137 mmol/L (ref 135–145)

## 2023-12-31 MED ORDER — TRAMADOL HCL 50 MG PO TABS
50.0000 mg | ORAL_TABLET | Freq: Four times a day (QID) | ORAL | 0 refills | Status: AC | PRN
  Filled 2023-12-31: qty 40, 5d supply, fill #0

## 2023-12-31 MED ORDER — OXYCODONE HCL 5 MG PO TABS
5.0000 mg | ORAL_TABLET | ORAL | 0 refills | Status: AC | PRN
  Filled 2023-12-31: qty 42, 7d supply, fill #0

## 2023-12-31 MED ORDER — ONDANSETRON HCL 4 MG PO TABS
4.0000 mg | ORAL_TABLET | Freq: Four times a day (QID) | ORAL | 0 refills | Status: AC | PRN
  Filled 2023-12-31: qty 20, 5d supply, fill #0

## 2023-12-31 MED ORDER — METHOCARBAMOL 500 MG PO TABS
500.0000 mg | ORAL_TABLET | Freq: Four times a day (QID) | ORAL | 0 refills | Status: AC | PRN
  Filled 2023-12-31: qty 40, 10d supply, fill #0

## 2023-12-31 MED ORDER — RIVAROXABAN 10 MG PO TABS
10.0000 mg | ORAL_TABLET | Freq: Every day | ORAL | 0 refills | Status: AC
  Filled 2023-12-31: qty 20, 20d supply, fill #0

## 2023-12-31 NOTE — Progress Notes (Signed)
 Physical Therapy Treatment Patient Details Name: Vanessa Skinner MRN: [id number] DOB: 29-Apr-1945 Today's Date: 12/31/2023   History of Present Illness 78 yo female s/p R TKA 12/30/23. Hx of gout, osteopenia    PT Comments  2nd session to practice stair negotiation with pt and family. Reviewed gait training and stair training. Encouraged pt to ambulate often at home, as tolerated. Briefly reviewed ice man operation. Plan is for OP PT f/u.     If plan is discharge home, recommend the following: A little help with walking and/or transfers;A little help with bathing/dressing/bathroom;Assistance with cooking/housework;Assist for transportation;Help with stairs or ramp for entrance   Can travel by private vehicle        Equipment Recommendations  None recommended by PT    Recommendations for Other Services       Precautions / Restrictions Precautions Precautions: Fall;Knee Restrictions Weight Bearing Restrictions Per Provider Order: No RLE Weight Bearing Per Provider Order: Weight bearing as tolerated     Mobility  Bed Mobility               General bed mobility comments: oob in recliner    Transfers Overall transfer level: Needs assistance Equipment used: Rolling walker (2 wheels) Transfers: Sit to/from Stand Sit to Stand: Contact guard assist           General transfer comment: Cues for safety, technqiue, hand/LE placement. Increased time.    Ambulation/Gait Ambulation/Gait assistance: Contact guard assist Gait Distance (Feet): 40 Feet Assistive device: Rolling walker (2 wheels) Gait Pattern/deviations: Step-to pattern, Antalgic, Decreased stance time - right, Decreased dorsiflexion - right       General Gait Details: Cues for safety, technique, sequencing. Intermittent assist to steady. Slow gait speed. Distance limited by pain, fatigue.   Stairs Stairs: Yes Min Assist    Number of Stairs: 6 General stair comments: up and over portable stairs x 3.  Twice with therapist and once with husband. Cues for safety, technique, sequencing. Assist to steady pt and stabilize RW.   Wheelchair Mobility     Tilt Bed    Modified Rankin (Stroke Patients Only)       Balance Overall balance assessment: Needs assistance         Standing balance support: Bilateral upper extremity supported, During functional activity, Reliant on assistive device for balance Standing balance-Leahy Scale: Fair                              Hotel Manager: No apparent difficulties  Cognition Arousal: Alert Behavior During Therapy: WFL for tasks assessed/performed   PT - Cognitive impairments: No apparent impairments                         Following commands: Intact      Cueing Cueing Techniques: Verbal cues  Exercises     General Comments        Pertinent Vitals/Pain Pain Assessment Pain Assessment: Faces Pain Score: 8  Faces Pain Scale: Hurts even more Pain Location: R knee with activity Pain Descriptors / Indicators: Grimacing, Operative site guarding, Aching Pain Intervention(s): Limited activity within patient's tolerance, Monitored during session, Repositioned    Home Living                          Prior Function            PT Goals (current goals  can now be found in the care plan section) Progress towards PT goals: Progressing toward goals    Frequency    7X/week      PT Plan      Co-evaluation              AM-PAC PT 6 Clicks Mobility   Outcome Measure  Help needed turning from your back to your side while in a flat bed without using bedrails?: A Little Help needed moving from lying on your back to sitting on the side of a flat bed without using bedrails?: A Little Help needed moving to and from a bed to a chair (including a wheelchair)?: A Little Help needed standing up from a chair using your arms (e.g., wheelchair or bedside chair)?: A  Little Help needed to walk in hospital room?: A Little Help needed climbing 3-5 steps with a railing? : A Little 6 Click Score: 18    End of Session Equipment Utilized During Treatment: Gait belt Activity Tolerance: Patient tolerated treatment well;Patient limited by pain;Patient limited by fatigue Patient left: in chair;with call bell/phone within reach   PT Visit Diagnosis: Muscle weakness (generalized) (M62.81);Difficulty in walking, not elsewhere classified (R26.2);Pain Pain - Right/Left: Right Pain - part of body: Knee     Time: 1320-1336 PT Time Calculation (min) (ACUTE ONLY): 16 min  Charges:    $Gait Training: 8-22 mins $Therapeutic Exercise: 8-22 mins PT General Charges $$ ACUTE PT VISIT: 1 Visit                       Dannial SQUIBB, PT Acute Rehabilitation  Office: [phone]

## 2023-12-31 NOTE — Progress Notes (Signed)
 Discharge instructions given to patient and husband questions asked and answered. Discharge medications delivered to patient at the bedside in a secure bag.

## 2023-12-31 NOTE — Plan of Care (Signed)
   Problem: Education: Goal: Knowledge of General Education information will improve Description Including pain rating scale, medication(s)/side effects and non-pharmacologic comfort measures Outcome: Progressing   Problem: Health Behavior/Discharge Planning: Goal: Ability to manage health-related needs will improve Outcome: Progressing

## 2023-12-31 NOTE — Plan of Care (Signed)

## 2023-12-31 NOTE — Care Management Obs Status (Signed)
 MEDICARE OBSERVATION STATUS NOTIFICATION   Patient Details  Name: Vanessa Skinner MRN: [id number] Date of Birth: Apr 15, 1945   Medicare Observation Status Notification Given:  Yes    Alfonse JONELLE Rex, RN 12/31/2023, 10:24 AM

## 2023-12-31 NOTE — Progress Notes (Signed)
Provided discharge education/instructions, all questions and concerns addressed. Meds delivered to room. Pt is not in any distress, discharged home with all of her belongings accompanied by husband.

## 2023-12-31 NOTE — Progress Notes (Signed)
   Subjective: 1 Day Post-Op Procedure(s) (LRB): ARTHROPLASTY, KNEE, TOTAL (Right) Patient reports pain as moderate.   Patient seen in rounds by Dr. Melodi. Patient is doing fair this morning. Denies chest pain or SOB. No issues overnight. Foley catheter removed this AM. We will continue therapy today  Objective: Vital signs in last 24 hours: Temp:  [97.5 F (36.4 C)-98.8 F (37.1 C)] 98.2 F (36.8 C) (11/18 0606) Pulse Rate:  [62-85] 73 (11/18 0606) Resp:  [11-25] 16 (11/18 0606) BP: (102-180)/(55-90) 135/60 (11/18 0606) SpO2:  [92 %-97 %] 95 % (11/18 0606)  Intake/Output from previous day:  Intake/Output Summary (Last 24 hours) at 12/31/2023 0831 Last data filed at 12/31/2023 0452 Gross per 24 hour  Intake 2755.71 ml  Output 1125 ml  Net 1630.71 ml     Intake/Output this shift: No intake/output data recorded.  Labs: Recent Labs    12/31/23 0334  HGB 13.6   Recent Labs    12/31/23 0334  WBC 13.6*  RBC 4.40  HCT 39.9  PLT 255   Recent Labs    12/31/23 0334  NA 137  K 4.0  CL 104  CO2 22  BUN 16  CREATININE 0.74  GLUCOSE 131*  CALCIUM 9.4   No results for input(s): LABPT, INR in the last 72 hours.  Exam: General - Patient is Alert and Oriented Extremity - Neurologically intact Neurovascular intact Sensation intact distally Dorsiflexion/Plantar flexion intact Dressing - dressing C/D/I Motor Function - intact, moving foot and toes well on exam.   Past Medical History:  Diagnosis Date   Allergy    seasonal   Anxiety    occasional   Arthritis    Cataract    Diverticulosis    DJD (degenerative joint disease)    Fatty liver disease, nonalcoholic    Gastritis    GERD (gastroesophageal reflux disease)    Gout    Hearing impaired    Heart murmur    MVP   Hemorrhoids    History of kidney stones    Hyperlipidemia    Hypertension    Hypothyroidism    Kidney stone 05/03/2022   Nephrolithiasis    Osteopenia    PONV (postoperative  nausea and vomiting)    Thyroid  disease     Assessment/Plan: 1 Day Post-Op Procedure(s) (LRB): ARTHROPLASTY, KNEE, TOTAL (Right) Principal Problem:   Primary osteoarthritis of right knee  Estimated body mass index is 30.55 kg/m as calculated from the following:   Height as of this encounter: 5' 4 (1.626 m).   Weight as of this encounter: 80.7 kg. Advance diet Up with therapy D/C IV fluids  DVT Prophylaxis - Xarelto Weight bearing as tolerated. Continue therapy.  Plan is to go Home after hospital stay.  Possible discharge later today if progresses with therapy and meeting goals.   Follow-up in the office in 2 weeks.  The PDMP database was reviewed today prior to any opioid medications being prescribed to this patient.  Roxie Mess, PA-C Orthopedic Surgery [phone] 12/31/2023, 8:31 AM

## 2023-12-31 NOTE — TOC Transition Note (Signed)
 Transition of Care [facility]) - Discharge Note   Patient Details  Name: Vanessa Skinner MRN: [id number] Date of Birth: [birth year]  Transition of Care [facility] CM/SW Contact:  Alfonse JONELLE Rex, RN Phone Number: 12/31/2023, 12:11 PM   Clinical Narrative:   Met with patient at bedside to review dc therapy and home equipment needs, patient confirmed OPPT-EO [place], has RW, no home equipment needs. MOON completed. No INPT CM needs.     Final next level of care: OP Rehab Barriers to Discharge: No Barriers Identified   Patient Goals and CMS Choice Patient states their goals for this hospitalization and ongoing recovery are:: return home          Discharge Placement                       Discharge Plan and Services Additional resources added to the After Visit Summary for                                       Social Drivers of Health (SDOH) Interventions SDOH Screenings   Food Insecurity: No Food Insecurity (12/30/2023)  Housing: Low Risk  (12/30/2023)  Transportation Needs: No Transportation Needs (12/30/2023)  Utilities: Not At Risk (12/30/2023)  Social Connections: Socially Integrated (12/30/2023)  Tobacco Use: Low Risk  (12/30/2023)     Readmission Risk Interventions     No data to display

## 2023-12-31 NOTE — Progress Notes (Signed)
 Physical Therapy Treatment Patient Details Name: Vanessa Skinner MRN: [id number] DOB: Jun 19, 1945 Today's Date: 12/31/2023   History of Present Illness 78 yo female s/p R TKA 12/30/23. Hx of gout, osteopenia    PT Comments  Pt agreeable to working with therapy. Moderate pain with activity. Ambulation distance limited by pain, fatigue. Will plan to have a 2nd session to practice stair negotiation. Plan is for OP PT f/u.     If plan is discharge home, recommend the following: A little help with walking and/or transfers;A little help with bathing/dressing/bathroom;Assistance with cooking/housework;Assist for transportation;Help with stairs or ramp for entrance   Can travel by private vehicle        Equipment Recommendations  None recommended by PT    Recommendations for Other Services       Precautions / Restrictions Precautions Precautions: Fall;Knee Restrictions Weight Bearing Restrictions Per Provider Order: No RLE Weight Bearing Per Provider Order: Weight bearing as tolerated     Mobility  Bed Mobility               General bed mobility comments: oob in recliner    Transfers Overall transfer level: Needs assistance Equipment used: Rolling walker (2 wheels) Transfers: Sit to/from Stand Sit to Stand: Contact guard assist           General transfer comment: Cues for safety, technqiue, hand/LE placement. Increased time.    Ambulation/Gait Ambulation/Gait assistance: Min assist Gait Distance (Feet): 40 Feet Assistive device: Rolling walker (2 wheels) Gait Pattern/deviations: Step-to pattern, Antalgic, Decreased stance time - right, Decreased dorsiflexion - right       General Gait Details: Cues for safety, technique, sequencing. Intermittent assist to steady. Slow gait speed. Distance limited by pain, fatigue.   Stairs             Wheelchair Mobility     Tilt Bed    Modified Rankin (Stroke Patients Only)       Balance Overall balance  assessment: Needs assistance         Standing balance support: Bilateral upper extremity supported, During functional activity, Reliant on assistive device for balance Standing balance-Leahy Scale: Fair                              Hotel Manager: No apparent difficulties  Cognition Arousal: Alert Behavior During Therapy: WFL for tasks assessed/performed   PT - Cognitive impairments: No apparent impairments                         Following commands: Intact      Cueing Cueing Techniques: Verbal cues  Exercises Total Joint Exercises Ankle Circles/Pumps: AROM, Both, 10 reps Quad Sets: AROM, Right, 10 reps Heel Slides: AAROM, Right, 10 reps Hip ABduction/ADduction: AAROM, Right, 10 reps Straight Leg Raises: AAROM, Right, 10 reps Goniometric ROM: ~15-50 degrees    General Comments        Pertinent Vitals/Pain Pain Assessment Pain Assessment: 0-10 Pain Score: 8  Pain Location: R knee with activity Pain Descriptors / Indicators: Grimacing, Operative site guarding, Aching Pain Intervention(s): Limited activity within patient's tolerance, Monitored during session, Ice applied, Repositioned    Home Living                          Prior Function            PT Goals (current goals can  now be found in the care plan section) Progress towards PT goals: Progressing toward goals    Frequency    7X/week      PT Plan      Co-evaluation              AM-PAC PT 6 Clicks Mobility   Outcome Measure  Help needed turning from your back to your side while in a flat bed without using bedrails?: A Little Help needed moving from lying on your back to sitting on the side of a flat bed without using bedrails?: A Little Help needed moving to and from a bed to a chair (including a wheelchair)?: A Little Help needed standing up from a chair using your arms (e.g., wheelchair or bedside chair)?: A Little Help  needed to walk in hospital room?: A Little Help needed climbing 3-5 steps with a railing? : A Lot 6 Click Score: 17    End of Session Equipment Utilized During Treatment: Gait belt Activity Tolerance: Patient tolerated treatment well;Patient limited by pain;Patient limited by fatigue Patient left: in chair;with call bell/phone within reach   PT Visit Diagnosis: Muscle weakness (generalized) (M62.81);Difficulty in walking, not elsewhere classified (R26.2);Pain Pain - Right/Left: Right Pain - part of body: Knee     Time: 9058-8984 PT Time Calculation (min) (ACUTE ONLY): 34 min  Charges:    $Gait Training: 8-22 mins $Therapeutic Exercise: 8-22 mins PT General Charges $$ ACUTE PT VISIT: 1 Visit                        Dannial SQUIBB, PT Acute Rehabilitation  Office: [phone]

## 2024-01-01 ENCOUNTER — Other Ambulatory Visit (HOSPITAL_COMMUNITY): Payer: Self-pay

## 2024-01-02 DIAGNOSIS — M25561 Pain in right knee: Secondary | ICD-10-CM | POA: Diagnosis not present

## 2024-01-06 DIAGNOSIS — M25561 Pain in right knee: Secondary | ICD-10-CM | POA: Diagnosis not present

## 2024-01-06 DIAGNOSIS — G8929 Other chronic pain: Secondary | ICD-10-CM | POA: Diagnosis not present

## 2024-01-06 NOTE — Discharge Summary (Signed)
 Patient ID: Vanessa Skinner MRN: [id number] DOB/AGE: 80-11-1945 80 y.o.  Admit date: 12/30/2023 Discharge date: 12/31/2023  Admission Diagnoses:  Principal Problem:   Primary osteoarthritis of right knee   Discharge Diagnoses:  Same  Past Medical History:  Diagnosis Date   Allergy    seasonal   Anxiety    occasional   Arthritis    Cataract    Diverticulosis    DJD (degenerative joint disease)    Fatty liver disease, nonalcoholic    Gastritis    GERD (gastroesophageal reflux disease)    Gout    Hearing impaired    Heart murmur    MVP   Hemorrhoids    History of kidney stones    Hyperlipidemia    Hypertension    Hypothyroidism    Kidney stone 05/03/2022   Nephrolithiasis    Osteopenia    PONV (postoperative nausea and vomiting)    Thyroid  disease     Surgeries: Procedure(s): ARTHROPLASTY, KNEE, TOTAL on 12/30/2023   Consultants:   Discharged Condition: Improved  Hospital Course: Vanessa Skinner is an 78 y.o. female who was admitted 12/30/2023 for operative treatment ofPrimary osteoarthritis of right knee. Patient has severe unremitting pain that affects sleep, daily activities, and work/hobbies. After pre-op clearance the patient was taken to the operating room on 12/30/2023 and underwent  Procedure(s): ARTHROPLASTY, KNEE, TOTAL.    Patient was given perioperative antibiotics:  Anti-infectives (From admission, onward)    Start     Dose/Rate Route Frequency Ordered Stop   12/30/23 1600  ceFAZolin  (ANCEF ) IVPB 2g/100 mL premix        2 g 200 mL/hr over 30 Minutes Intravenous Every 6 hours 12/30/23 1336 12/31/23 0855   12/30/23 0800  ceFAZolin  (ANCEF ) IVPB 2g/100 mL premix        2 g 200 mL/hr over 30 Minutes Intravenous On call to O.R. 12/30/23 0752 12/30/23 1053        Patient was given sequential compression devices, early ambulation, and chemoprophylaxis to prevent DVT.  Patient benefited maximally from hospital stay and there were no complications.     Recent vital signs: No data found.   Recent laboratory studies: No results for input(s): WBC, HGB, HCT, PLT, NA, K, CL, CO2, BUN, CREATININE, GLUCOSE, INR, CALCIUM in the last 72 hours.  Invalid input(s): PT, 2   Discharge Medications:   Allergies as of 12/31/2023       Reactions   Kenalog  [triamcinolone  Acetonide]    Latex    Propoxyphene Hcl Nausea Only   Epinephrine  Palpitations   REACTION: LARGE AMOUNT/heart races   Morphine Itching, Rash   Sulfonamide Derivatives Itching, Rash        Medication List     STOP taking these medications    aspirin EC 81 MG tablet       TAKE these medications    ALPRAZolam  0.5 MG tablet Commonly known as: XANAX  Take 0.25-5 mg by mouth at bedtime as needed for sleep. Notes to patient: Last dose taken: 11/17 09:16pm   amLODipine  5 MG tablet Commonly known as: NORVASC  Take 5 mg by mouth at bedtime. Notes to patient: Resume home regimen   Betasept  Surgical Scrub 4 % external liquid Generic drug: chlorhexidine  Apply 15 mLs topically as directed daily for 5 days every other week for 6 weeks.   carboxymethylcellulose 0.5 % Soln Commonly known as: REFRESH PLUS Place 1 drop into both eyes daily as needed (dry eyes). Notes to patient: Resume home regimen  colchicine 0.6 MG tablet Take 0.6 mg by mouth every other day. Notes to patient: Resume home regimen   dicyclomine  10 MG capsule Commonly known as: BENTYL  TAKE 1 CAPSULE BY MOUTH EVERY MORNING AND TAKE 1 CAPSULE BY MOUTH EVERY NIGHT AT BEDTIME What changed: See the new instructions. Notes to patient: 11/18 bedtime   ezetimibe  10 MG tablet Commonly known as: ZETIA  Take 10 mg by mouth daily.   famotidine  20 MG tablet Commonly known as: PEPCID  TAKE 1 TABLET BY MOUTH 2 TIMES A DAY What changed:  when to take this reasons to take this Notes to patient: Resume home regimen   furosemide  20 MG tablet Commonly known as: LASIX  Take 20 mg by  mouth daily.   levothyroxine  88 MCG tablet Commonly known as: SYNTHROID  Take 88 mcg by mouth daily before breakfast.   methocarbamol  500 MG tablet Commonly known as: ROBAXIN  Take 1 tablet (500 mg total) by mouth every 6 (six) hours as needed for muscle spasms. Notes to patient: Last dose taken: 11/18 at 01:08am   mupirocin  ointment 2 % Commonly known as: BACTROBAN  Place into the nose 2 (two) times daily as directed for 5 days every other week for 6 weeks.   ondansetron  4 MG tablet Commonly known as: ZOFRAN  Take 1 tablet (4 mg total) by mouth every 6 (six) hours as needed for nausea. Notes to patient: Last dose taken: 11/17 at 10:52am   oxyCODONE  5 MG immediate release tablet Commonly known as: Oxy IR/ROXICODONE  Take 1 tablet (5 mg total) by mouth every 4 (four) hours as needed for severe pain (pain score 7-10). Notes to patient: Last dose taken: 11/18 06:56am   potassium citrate  10 MEQ (1080 MG) SR tablet Commonly known as: UROCIT-K  Take 10 mEq by mouth daily.   pravastatin  40 MG tablet Commonly known as: PRAVACHOL  Take 40 mg by mouth daily.   Replesta 1.25 MG (50000 UT) Wafr Generic drug: Cholecalciferol Take 50,000 Units by mouth every Thursday. Notes to patient: Resume home regimen   sodium chloride  5 % ophthalmic ointment Commonly known as: MURO 128 Place 1 Application into both eyes at bedtime as needed for eye irritation (dry eyes).   Tekturna  150 MG tablet Generic drug: aliskiren  Take 150 mg by mouth daily.   traMADol  50 MG tablet Commonly known as: ULTRAM  Take 1-2 tablets (50-100 mg total) by mouth every 6 (six) hours as needed for moderate pain (pain score 4-6).   Tylenol  8 Hour Arthritis Pain 650 MG CR tablet Generic drug: acetaminophen  Take 650 mg by mouth every 8 (eight) hours as needed for pain. Notes to patient: Last dose taken: 11/17 at 05:05am   Xarelto  10 MG Tabs tablet Generic drug: rivaroxaban  Take 1 tablet (10 mg total) by mouth daily with  breakfast for 20 days. Then resume one 81 mg aspirin once a day               Discharge Care Instructions  (From admission, onward)           Start     Ordered   12/31/23 0000  Weight bearing as tolerated        12/31/23 0833   12/31/23 0000  Change dressing       Comments: You may remove the bulky bandage (ACE wrap and gauze) two days after surgery. You will have an adhesive waterproof bandage underneath. Leave this in place until your first follow-up appointment.   12/31/23 9166  Diagnostic Studies: No results found.  Disposition: Discharge disposition: 01-Home or Self Care       Discharge Instructions     Call MD / Call 911   Complete by: As directed    If you experience chest pain or shortness of breath, CALL 911 and be transported to the hospital emergency room.  If you develope a fever above 101 F, pus (white drainage) or increased drainage or redness at the wound, or calf pain, call your surgeon's office.   Change dressing   Complete by: As directed    You may remove the bulky bandage (ACE wrap and gauze) two days after surgery. You will have an adhesive waterproof bandage underneath. Leave this in place until your first follow-up appointment.   Constipation Prevention   Complete by: As directed    Drink plenty of fluids.  Prune juice may be helpful.  You may use a stool softener, such as Colace (over the counter) 100 mg twice a day.  Use MiraLax  (over the counter) for constipation as needed.   Diet - low sodium heart healthy   Complete by: As directed    Do not put a pillow under the knee. Place it under the heel.   Complete by: As directed    Driving restrictions   Complete by: As directed    No driving for two weeks   Post-operative opioid taper instructions:   Complete by: As directed    POST-OPERATIVE OPIOID TAPER INSTRUCTIONS: It is important to wean off of your opioid medication as soon as possible. If you do not need pain medication  after your surgery it is ok to stop day one. Opioids include: Codeine, Hydrocodone(Norco, Vicodin), Oxycodone (Percocet, oxycontin ) and hydromorphone  amongst others.  Long term and even short term use of opiods can cause: Increased pain response Dependence Constipation Depression Respiratory depression And more.  Withdrawal symptoms can include Flu like symptoms Nausea, vomiting And more Techniques to manage these symptoms Hydrate well Eat regular healthy meals Stay active Use relaxation techniques(deep breathing, meditating, yoga) Do Not substitute Alcohol  to help with tapering If you have been on opioids for less than two weeks and do not have pain than it is ok to stop all together.  Plan to wean off of opioids This plan should start within one week post op of your joint replacement. Maintain the same interval or time between taking each dose and first decrease the dose.  Cut the total daily intake of opioids by one tablet each day Next start to increase the time between doses. The last dose that should be eliminated is the evening dose.      TED hose   Complete by: As directed    Use stockings (TED hose) for three weeks on both leg(s).  You may remove them at night for sleeping.   Weight bearing as tolerated   Complete by: As directed         Follow-up Information     Aluisio, Dempsey, MD. Schedule an appointment as soon as possible for a visit in 2 week(s).   Specialty: Orthopedic Surgery Contact information: [address] [place] [address] [phone]                  Signed: Roxie Mess 01/06/2024, 9:21 AM

## 2024-01-13 DIAGNOSIS — M25561 Pain in right knee: Secondary | ICD-10-CM | POA: Diagnosis not present

## 2024-01-13 DIAGNOSIS — G8929 Other chronic pain: Secondary | ICD-10-CM | POA: Diagnosis not present

## 2024-01-16 DIAGNOSIS — M25561 Pain in right knee: Secondary | ICD-10-CM | POA: Diagnosis not present

## 2024-01-20 DIAGNOSIS — M25561 Pain in right knee: Secondary | ICD-10-CM | POA: Diagnosis not present

## 2024-01-23 DIAGNOSIS — M25561 Pain in right knee: Secondary | ICD-10-CM | POA: Diagnosis not present

## 2024-01-24 ENCOUNTER — Other Ambulatory Visit (HOSPITAL_COMMUNITY): Payer: Self-pay

## 2024-01-27 DIAGNOSIS — M25561 Pain in right knee: Secondary | ICD-10-CM | POA: Diagnosis not present

## 2024-02-12 ENCOUNTER — Other Ambulatory Visit (HOSPITAL_COMMUNITY): Payer: Self-pay

## 2024-02-26 ENCOUNTER — Ambulatory Visit (HOSPITAL_COMMUNITY)
Admission: RE | Admit: 2024-02-26 | Discharge: 2024-02-26 | Disposition: A | Discharge status: Home / Self Care | Source: Ambulatory Visit | Attending: Cardiology | Admitting: Cardiology

## 2024-02-26 DIAGNOSIS — I7121 Aneurysm of the ascending aorta, without rupture: Secondary | ICD-10-CM | POA: Insufficient documentation

## 2024-02-26 MED ORDER — IOHEXOL 350 MG/ML SOLN
75.0000 mL | Freq: Once | INTRAVENOUS | Status: AC | PRN
  Administered 2024-02-26: 75 mL via INTRAVENOUS

## 2024-02-27 ENCOUNTER — Ambulatory Visit: Payer: Self-pay | Admitting: Cardiovascular Disease

## 2024-02-28 ENCOUNTER — Telehealth: Payer: Self-pay | Admitting: Cardiovascular Disease

## 2024-02-28 NOTE — Telephone Encounter (Signed)
 Pt called to follow up on CT results please advise

## 2024-02-28 NOTE — Telephone Encounter (Signed)
 Spoke with pt, .aware of results.
